# Patient Record
Sex: Female | Born: 1985
Health system: Southern US, Community
[De-identification: ages and names within clinical notes are randomized; demographics above are authoritative.]

---

## 2012-03-24 ENCOUNTER — Ambulatory Visit (INDEPENDENT_AMBULATORY_CARE_PROVIDER_SITE_OTHER): Payer: No Typology Code available for payment source | Admitting: Family Medicine

## 2012-03-24 ENCOUNTER — Encounter: Payer: Self-pay | Admitting: Family Medicine

## 2012-03-24 VITALS — BP 123/79 | HR 88 | Temp 98.6°F | Resp 16 | Ht 64.25 in | Wt 199.8 lb

## 2012-03-24 DIAGNOSIS — Z23 Encounter for immunization: Secondary | ICD-10-CM

## 2012-03-24 DIAGNOSIS — Z0000 Encounter for general adult medical examination without abnormal findings: Secondary | ICD-10-CM

## 2012-03-24 LAB — POCT CBC
HCT, POC: 44.8 % (ref 37.7–47.9)
Lymph, poc: 3.5 — AB (ref 0.6–3.4)
MCHC: 32.6 g/dL (ref 31.8–35.4)
MCV: 85.9 fL (ref 80–97)
POC LYMPH PERCENT: 37.3 %L (ref 10–50)
RDW, POC: 13.7 %

## 2012-03-24 MED ORDER — NORETHIN-ETH ESTRAD TRIPHASIC 0.5/0.75/1-35 MG-MCG PO TABS: 1.0000 | ORAL_TABLET | Freq: Every day | ORAL | Status: DC

## 2012-03-24 NOTE — Progress Notes (Signed)
  Subjective:    Patient ID: Krystal Gallagher, female    DOB: 11-Oct-1986, 26 y.o.   MRN: [id number]  HPI  Patient presents to clinic requesting pap smear and birth control.  This will patient's first pap smear. She is in a monogamous relationship which she states is healthy. No history of abuse  Denies vaginal discharge, dysuria or pelvic pain  Review of Systems  HENT: Positive for ear pain (L) ear with intermittant symptoms.   Genitourinary: Negative for dysuria, vaginal discharge, genital sores, menstrual problem and pelvic pain.      Objective:   Physical Exam  Constitutional: She appears well-developed.  HENT:  Head: Normocephalic and atraumatic.  Neck: Neck supple.  Cardiovascular: Normal rate, regular rhythm and normal heart sounds.   Pulmonary/Chest: Effort normal and breath sounds normal.  Abdominal: Soft. Bowel sounds are normal. There is no tenderness.  Genitourinary: Vagina normal and uterus normal. There is no rash or lesion on the right labia. There is no rash or lesion on the left labia. Cervix exhibits no motion tenderness. Right adnexum displays no mass and no tenderness. Left adnexum displays no mass and no tenderness. No erythema or tenderness around the vagina. No vaginal discharge found.  Neurological: She is alert.  Skin: Skin is warm.     U Preg - neg      Assessment & Plan:   1. Routine general medical examination at a health care facility  Lipid panel, TSH, Pap IG, CT/NG w/ reflex HPV when ASC-U, Tdap vaccine greater than or equal to 7yo IM, HIV antibody, RPR, POCT CBC, POCT urine pregnancy   See medications on AVS

## 2012-03-25 LAB — HIV ANTIBODY (ROUTINE TESTING W REFLEX): HIV: NONREACTIVE

## 2012-03-25 LAB — LIPID PANEL
Cholesterol: 221 mg/dL — ABNORMAL HIGH (ref 0–200)
LDL Cholesterol: 160 mg/dL — ABNORMAL HIGH (ref 0–99)
VLDL: 29 mg/dL (ref 0–40)

## 2012-03-26 LAB — RPR

## 2012-03-27 ENCOUNTER — Encounter: Payer: Self-pay | Admitting: Family Medicine

## 2012-03-28 ENCOUNTER — Other Ambulatory Visit: Payer: Self-pay | Admitting: Family Medicine

## 2012-03-30 LAB — PAP IG, CT-NG, RFX HPV ASCU

## 2012-03-31 ENCOUNTER — Telehealth: Payer: Self-pay | Admitting: Family Medicine

## 2012-03-31 NOTE — Telephone Encounter (Signed)
WANTS TO KNOW LAB RESULTS ° °

## 2012-03-31 NOTE — Telephone Encounter (Signed)
LM on patient's VM asking for return call to discuss BW.

## 2012-04-11 ENCOUNTER — Encounter: Payer: Self-pay | Admitting: Family Medicine

## 2012-04-11 ENCOUNTER — Ambulatory Visit (INDEPENDENT_AMBULATORY_CARE_PROVIDER_SITE_OTHER): Payer: No Typology Code available for payment source | Admitting: Family Medicine

## 2012-04-11 VITALS — BP 117/80 | HR 71 | Temp 97.9°F | Resp 18 | Ht 64.0 in | Wt 197.0 lb

## 2012-04-11 DIAGNOSIS — E039 Hypothyroidism, unspecified: Secondary | ICD-10-CM

## 2012-04-11 LAB — POCT SEDIMENTATION RATE: POCT SED RATE: 14 mm/hr (ref 0–22)

## 2012-04-11 MED ORDER — LEVOTHYROXINE SODIUM 25 MCG PO TABS: 25.0000 ug | ORAL_TABLET | Freq: Every day | ORAL | Status: DC

## 2012-04-11 NOTE — Progress Notes (Signed)
  Subjective:    Patient ID: Krystal Gallagher, female    DOB: [birth year], 26 y.o.   MRN: [id number]  HPI  Patient presents in follow up of screening blood work that showed an elevated TSH 12.0.  Per patient recent viral illness prior to lab work however did not notice any neck tenderness during that time. No known family history of thyroid disease  Patient admits to fatigue and malaise however no other symptoms consistent with thyroid disease   Review of Systems     Objective:   Physical Exam  Constitutional: She appears well-developed.  Neck: Neck supple. Thyromegaly: nontender (B) lobes full; nodules not palpable.  Cardiovascular: Normal rate, regular rhythm and normal heart sounds.   Pulmonary/Chest: Effort normal and breath sounds normal.  Neurological: She is alert.  Skin: Skin is warm.       No edema; patella reflexes normal without delayed upstroke     Results for orders placed in visit on 04/11/12  POCT SEDIMENTATION RATE      Component Value Range   POCT SED RATE 14  0 - 22 (mm/hr)       Assessment & Plan:  Hypothyroid-new diagnosis  Thyroid ultrasound Sed rate; repeat TSH Synthroid 25 mg QD; titirate up to achieve a TSH of 2.0-3.0 Follow up in 3 months for repeat TSH

## 2012-04-13 ENCOUNTER — Telehealth: Payer: Self-pay | Admitting: Family Medicine

## 2012-04-13 NOTE — Telephone Encounter (Signed)
LM regarding elevated TSH. Patient to increase Synthroid to 50 mcg daily after 10 days and recheck TSH in 3 weeks. Thyroid US ordered.

## 2012-05-02 ENCOUNTER — Encounter: Payer: Self-pay | Admitting: Family Medicine

## 2012-05-02 ENCOUNTER — Ambulatory Visit (INDEPENDENT_AMBULATORY_CARE_PROVIDER_SITE_OTHER): Payer: No Typology Code available for payment source | Admitting: Family Medicine

## 2012-05-02 VITALS — BP 115/77 | HR 59 | Temp 98.0°F | Resp 16 | Wt 197.0 lb

## 2012-05-02 DIAGNOSIS — E049 Nontoxic goiter, unspecified: Secondary | ICD-10-CM

## 2012-05-02 DIAGNOSIS — E039 Hypothyroidism, unspecified: Secondary | ICD-10-CM

## 2012-05-02 NOTE — Progress Notes (Signed)
  Subjective:    Patient ID: Krystal Gallagher, female    DOB: May 09, 1986, 26 y.o.   MRN: [id number]  HPI  Patient presents in follow up of hypothyroid Patient tolerated 25 mcg daily of synthroid however after increasing to 50 mcg daily felt "overcaffinated". Titrated back down to 25 mcg daily   Review of Systems     Objective:   Physical Exam  Constitutional: She appears well-developed.  HENT:       No ocular changes  Neck: Neck supple. Thyromegaly (goiter present) present.  Cardiovascular: Normal rate, regular rhythm and normal heart sounds.   Pulmonary/Chest: Effort normal and breath sounds normal.  Abdominal: Soft. Bowel sounds are normal.  Neurological: She is alert. She has normal reflexes.  Skin: Skin is warm.  Psychiatric: She has a normal mood and affect.        Assessment & Plan:   1. Hypothyroid; goiter TSH; thyroid ultrasound 05/03/12   Anticipatory guidance

## 2012-05-03 ENCOUNTER — Ambulatory Visit
Admission: RE | Admit: 2012-05-03 | Discharge: 2012-05-03 | Disposition: A | Discharge status: Home / Self Care | Payer: PRIVATE HEALTH INSURANCE | Source: Ambulatory Visit | Attending: Family Medicine | Admitting: Family Medicine

## 2012-05-03 ENCOUNTER — Other Ambulatory Visit: Payer: Self-pay

## 2012-05-03 DIAGNOSIS — E039 Hypothyroidism, unspecified: Secondary | ICD-10-CM

## 2012-05-03 LAB — TSH: TSH: 7.859 u[IU]/mL — ABNORMAL HIGH (ref 0.350–4.500)

## 2012-06-21 ENCOUNTER — Ambulatory Visit: Payer: Self-pay | Admitting: Family Medicine

## 2012-07-12 ENCOUNTER — Ambulatory Visit: Payer: No Typology Code available for payment source | Admitting: Family Medicine

## 2012-09-19 ENCOUNTER — Telehealth: Payer: Self-pay

## 2012-09-19 DIAGNOSIS — E039 Hypothyroidism, unspecified: Secondary | ICD-10-CM

## 2012-09-19 MED ORDER — LEVOTHYROXINE SODIUM 25 MCG PO TABS: 25.0000 ug | ORAL_TABLET | Freq: Every day | ORAL | Status: DC

## 2012-09-19 NOTE — Telephone Encounter (Signed)
Patient on Levothyroxine 25 mcg.

## 2012-09-19 NOTE — Telephone Encounter (Signed)
Patient scheduled a follow up/Rx Refill with Dr. Audria Nine next Tuesday but will run out of her thyroid medication a few days before her appt. She would like to know if we could call in a refill to last her until her appt date. [phone]  [employer]

## 2012-09-19 NOTE — Telephone Encounter (Signed)
Left message on machine that medication was sent to pharmacy. 

## 2012-09-19 NOTE — Telephone Encounter (Signed)
Rx sent to pharmacy   

## 2012-09-27 ENCOUNTER — Ambulatory Visit: Payer: Self-pay | Admitting: Family Medicine

## 2012-09-30 ENCOUNTER — Ambulatory Visit (INDEPENDENT_AMBULATORY_CARE_PROVIDER_SITE_OTHER): Payer: No Typology Code available for payment source | Admitting: Family Medicine

## 2012-09-30 ENCOUNTER — Encounter: Payer: Self-pay | Admitting: Family Medicine

## 2012-09-30 VITALS — BP 103/69 | HR 68 | Temp 98.6°F | Resp 16 | Ht 64.0 in | Wt 190.0 lb

## 2012-09-30 DIAGNOSIS — Z23 Encounter for immunization: Secondary | ICD-10-CM

## 2012-09-30 DIAGNOSIS — E039 Hypothyroidism, unspecified: Secondary | ICD-10-CM

## 2012-09-30 MED ORDER — LEVOTHYROXINE SODIUM 50 MCG PO TABS: 50.0000 ug | ORAL_TABLET | Freq: Every day | ORAL | Status: DC

## 2012-09-30 NOTE — Progress Notes (Signed)
S:   This 26 y.o. AA female has Hypothyroidism with medication on board since May 2013. She has been taking 2 tabs ( each) and feels like that is the right dose. She has infrequent palpitations only after exertion. She denies any difficulty swallowing, HAs, CP or tightness, dizziness or weakness, GI discomfort or tremor.  ROS; As per HPI.  O:  Filed Vitals:   09/30/12 1550  BP: 103/69  Pulse: 68  Temp: 98.6 F (37 C)  Resp: 16   GEN: In NAD; WN,WD.     Weight down 9 lbs. HENT: [place]/AT; EOMI w/ clear conj/scl. Oroph benign. NECK: Supple w/o LAN or TMG. Anterior aspect appears full and wide. COR: RRR. No m/g/r;no edema. LUNGS: CTA. NEURO: A&O x 3; CNs intact. DTRs 0-1+/=. Gait normal. Muscle tone normal.   A/P:  1. Hypothyroid  April 2013: LDL chol= 160, Total = 221 RX: Levothyroxine 50 mcg 1 tab daily pending labs TSH, T4, Free  LDL Cholesterol, Direct  2. Need for prophylactic vaccination and inoculation against influenza  Flu vaccine greater than or equal to 3yo preservative free IM

## 2012-09-30 NOTE — Patient Instructions (Signed)

## 2012-10-01 ENCOUNTER — Other Ambulatory Visit: Payer: Self-pay | Admitting: Family Medicine

## 2012-10-01 DIAGNOSIS — E039 Hypothyroidism, unspecified: Secondary | ICD-10-CM

## 2012-10-01 LAB — T4, FREE: Free T4: 1.06 ng/dL (ref 0.80–1.80)

## 2012-10-01 LAB — TSH: TSH: 9.547 u[IU]/mL — ABNORMAL HIGH (ref 0.350–4.500)

## 2012-10-01 LAB — LDL CHOLESTEROL, DIRECT: Direct LDL: 133 mg/dL — ABNORMAL HIGH

## 2012-10-01 MED ORDER — LEVOTHYROXINE SODIUM 50 MCG PO TABS: 50.0000 ug | ORAL_TABLET | Freq: Every day | ORAL | Status: DC

## 2012-10-01 MED ORDER — LEVOTHYROXINE SODIUM 88 MCG PO TABS: 88.0000 ug | ORAL_TABLET | Freq: Every day | ORAL | Status: DC

## 2012-10-01 NOTE — Progress Notes (Signed)
Quick Note:  Please call pt and advise that the following labs are abnormal... Thyroid test result shows that you need to be on a higher dose of medication. You have Levothyroxine 50 mcg tablets; take 2 tablets daily (= 100 mcg). I am changing your medication dose to 88 mcg; when you go to pick up your refill, please note the dose change. You will go back to 1 tablet daily (88 mcg). The pharmacy has been notified of the change.  Thyroid test will need to be rechecked in ~ 2 months. (I will place a future order for you to return in mid January 2014).  Copy to pt. ______

## 2013-02-21 ENCOUNTER — Other Ambulatory Visit: Payer: Self-pay

## 2013-02-21 DIAGNOSIS — E039 Hypothyroidism, unspecified: Secondary | ICD-10-CM

## 2013-02-21 MED ORDER — LEVOTHYROXINE SODIUM 88 MCG PO TABS: 88.0000 ug | ORAL_TABLET | Freq: Every day | ORAL | Status: DC

## 2013-02-21 MED ORDER — NORETHIN-ETH ESTRAD TRIPHASIC 0.5/0.75/1-35 MG-MCG PO TABS: 1.0000 | ORAL_TABLET | Freq: Every day | ORAL | Status: DC

## 2013-02-21 NOTE — Telephone Encounter (Signed)
Pended please advise.  

## 2013-02-21 NOTE — Telephone Encounter (Signed)
Pt has scheduled an appt with Dr. Audria Nine for 4/3. Would like refills of netcon 777 and thyroid meds to carry her til this appt.  [facility] [address]  [phone]

## 2013-03-02 ENCOUNTER — Encounter: Payer: Self-pay | Admitting: Family Medicine

## 2013-03-02 ENCOUNTER — Ambulatory Visit (INDEPENDENT_AMBULATORY_CARE_PROVIDER_SITE_OTHER): Payer: No Typology Code available for payment source | Admitting: Family Medicine

## 2013-03-02 VITALS — BP 118/98 | HR 68 | Temp 97.9°F | Resp 16 | Ht 64.0 in | Wt 191.4 lb

## 2013-03-02 DIAGNOSIS — E039 Hypothyroidism, unspecified: Secondary | ICD-10-CM

## 2013-03-02 DIAGNOSIS — E669 Obesity, unspecified: Secondary | ICD-10-CM

## 2013-03-02 LAB — T4, FREE: Free T4: 1.22 ng/dL (ref 0.80–1.80)

## 2013-03-02 LAB — TSH: TSH: 0.887 u[IU]/mL (ref 0.350–4.500)

## 2013-03-02 MED ORDER — LEVOTHYROXINE SODIUM 88 MCG PO TABS: 88.0000 ug | ORAL_TABLET | Freq: Every day | ORAL | Status: DC

## 2013-03-02 NOTE — Patient Instructions (Signed)
Exercise to Stay Healthy Exercise helps you become and stay healthy. EXERCISE IDEAS AND TIPS Choose exercises that: You enjoy. Fit into your day. You do not need to exercise really hard to be healthy. You can do exercises at a slow or medium level and stay healthy. You can: Stretch before and after working out. Try yoga, Pilates, or tai chi. Lift weights. Walk fast, swim, jog, run, climb stairs, bicycle, dance, or rollerskate. Take aerobic classes. Exercises that burn about 150 calories: Running 1  miles in 15 minutes. Playing volleyball for 45 to 60 minutes. Washing and waxing a car for 45 to 60 minutes. Playing touch football for 45 minutes. Walking 1  miles in 35 minutes. Pushing a stroller 1  miles in 30 minutes. Playing basketball for 30 minutes. Raking leaves for 30 minutes. Bicycling 5 miles in 30 minutes. Walking 2 miles in 30 minutes. Dancing for 30 minutes. Shoveling snow for 15 minutes. Swimming laps for 20 minutes. Walking up stairs for 15 minutes. Bicycling 4 miles in 15 minutes. Gardening for 30 to 45 minutes. Jumping rope for 15 minutes. Washing windows or floors for 45 to 60 minutes. Document Released: 12/19/2010 Document Revised: 02/08/2012 Document Reviewed: 12/19/2010 [facility] Patient Information 2013 [place], [place].    Exercise to Lose Weight Exercise and a healthy diet may help you lose weight. Your doctor may suggest specific exercises. EXERCISE IDEAS AND TIPS  Choose low-cost things you enjoy doing, such as walking, bicycling, or exercising to workout videos.  Take stairs instead of the elevator.  Walk during your lunch break.  Park your car further away from work or school.  Go to a gym or an exercise class.  Start with 5 to 10 minutes of exercise each day. Build up to 30 minutes of exercise 4 to 6 days a week.  Wear shoes with good support and comfortable clothes.  Stretch before and after working out.  Work out until you breathe  harder and your heart beats faster.  Drink extra water when you exercise.  Do not do so much that you hurt yourself, feel dizzy, or get very short of breath. Exercises that burn about 150 calories:  Running 1  miles in 15 minutes.  Playing volleyball for 45 to 60 minutes.  Washing and waxing a car for 45 to 60 minutes.  Playing touch football for 45 minutes.  Walking 1  miles in 35 minutes.  Pushing a stroller 1  miles in 30 minutes.  Playing basketball for 30 minutes.  Raking leaves for 30 minutes.  Bicycling 5 miles in 30 minutes.  Walking 2 miles in 30 minutes.  Dancing for 30 minutes.  Shoveling snow for 15 minutes.  Swimming laps for 20 minutes.  Walking up stairs for 15 minutes.  Bicycling 4 miles in 15 minutes.  Gardening for 30 to 45 minutes.  Jumping rope for 15 minutes.  Washing windows or floors for 45 to 60 minutes. Document Released: 12/19/2010 Document Revised: 02/08/2012 Document Reviewed: 12/19/2010 [facility] Patient Information 2013 [place], [place].

## 2013-03-03 NOTE — Progress Notes (Signed)
Quick Note:  Please contact pt and advise that labs are normal.    Vitamin D is low normal so get OTC supplement D3 1000 IU and take it daily along with daily sun exposure and Vitamin D-rich foods 3-4 times a week.  Copy to pt.  ______

## 2013-03-05 ENCOUNTER — Encounter: Payer: Self-pay | Admitting: Family Medicine

## 2013-03-05 DIAGNOSIS — E669 Obesity, unspecified: Secondary | ICD-10-CM | POA: Insufficient documentation

## 2013-03-05 NOTE — Progress Notes (Signed)
S:  This 27 y.o. AA female is here for Hypothyroidism f/u and labs. She reports feeling much better w/ more energy. Her goal now is a healthier lifestyle with weight loss. She is compliant with medication and denies any diaphoresis, fatigue, abnormal weight change, CP or palpitations, SOB or DOE, n/v or GI problems, myalgias, skin change or hair loss, HA, vision disturbance, tremors, weakness or sleep disturbances.  O:  Filed Vitals:   03/02/13 1520  BP: 118/98  Pulse: 68  Temp: 97.9 F (36.6 C)  Resp: 16   GEN: In NAD; WN,WD. HENT: [place]/AT; EOMI w/ clear conj/sclerae. Oroph clear and moist. COR: RRR. LUNGS: Normal resp rate and effort. SKIN: W&D. NUERO: A&O x 3; CNs intact. Nonfocal.  A/P: Unspecified hypothyroidism - stable; continue current dose.    Plan: levothyroxine (SYNTHROID, LEVOTHROID) 88 MCG tablet, TSH, T4, Free, T3, Free  Obesity (BMI 30.0-34.9) - encourage healthy weight loss plan.   Plan: Vitamin D, 25-hydroxy

## 2013-03-06 ENCOUNTER — Encounter: Payer: Self-pay | Admitting: Radiology

## 2013-03-22 ENCOUNTER — Telehealth: Payer: Self-pay

## 2013-03-22 MED ORDER — NORETHIN-ETH ESTRAD TRIPHASIC 0.5/0.75/1-35 MG-MCG PO TABS: 1.0000 | ORAL_TABLET | Freq: Every day | ORAL | Status: DC

## 2013-03-22 NOTE — Telephone Encounter (Signed)
PT STATES SHE WAS TO GET A REFILL ON HER BCP'S AND IT WASN'T DONE. PLEASE CALL [phone] AND IT WAS HER NOVUM   [facility] ON [address]

## 2013-03-22 NOTE — Telephone Encounter (Signed)
Pended please advise. She looks like she is due for f/u

## 2013-03-22 NOTE — Telephone Encounter (Signed)
I have sent a 1 month supply but she needs to return for a pap test

## 2013-03-23 NOTE — Telephone Encounter (Signed)
Left message for her, she needs RTC before this runs out.

## 2013-03-31 ENCOUNTER — Encounter: Payer: Self-pay | Admitting: Family Medicine

## 2013-03-31 ENCOUNTER — Ambulatory Visit (INDEPENDENT_AMBULATORY_CARE_PROVIDER_SITE_OTHER): Payer: No Typology Code available for payment source | Admitting: Family Medicine

## 2013-03-31 VITALS — BP 108/64 | HR 71 | Temp 98.1°F | Resp 16 | Ht 64.5 in | Wt 190.4 lb

## 2013-03-31 DIAGNOSIS — Z0000 Encounter for general adult medical examination without abnormal findings: Secondary | ICD-10-CM

## 2013-03-31 DIAGNOSIS — Z01419 Encounter for gynecological examination (general) (routine) without abnormal findings: Secondary | ICD-10-CM

## 2013-03-31 DIAGNOSIS — E039 Hypothyroidism, unspecified: Secondary | ICD-10-CM

## 2013-03-31 DIAGNOSIS — Z3041 Encounter for surveillance of contraceptive pills: Secondary | ICD-10-CM

## 2013-03-31 DIAGNOSIS — Z124 Encounter for screening for malignant neoplasm of cervix: Secondary | ICD-10-CM

## 2013-03-31 LAB — POCT URINALYSIS DIPSTICK
Bilirubin, UA: NEGATIVE
Blood, UA: NEGATIVE
Glucose, UA: NEGATIVE
Leukocytes, UA: NEGATIVE
Nitrite, UA: NEGATIVE
Protein, UA: NEGATIVE
Spec Grav, UA: 1.02
Urobilinogen, UA: 0.2
pH, UA: 7.5

## 2013-03-31 MED ORDER — NORETHIN-ETH ESTRAD TRIPHASIC 0.5/0.75/1-35 MG-MCG PO TABS: 1.0000 | ORAL_TABLET | Freq: Every day | ORAL | Status: DC

## 2013-03-31 NOTE — Patient Instructions (Signed)

## 2013-03-31 NOTE — Progress Notes (Signed)
Subjective:    Patient ID: Krystal Gallagher, female    DOB: Oct 04, 1986, 27 y.o.   MRN: [id number]  HPI   This 27 y.o. AA female is here for CPE/PAP. She uses OCPs w/o adverse effects. Pt  Has  hypothyroidism, on adequate replacement based on November 2013 labs. She has good energy   level and denies any symptoms c/w hypothyroidism (i.e hair loss, skin changes, abnormal weight  gain or constipation).  Pt is sexually active and in a stable monogamous relationship.   Immunizations: UTD; pt does not recall receiving Gardisil vaccine series.  No documentation re: Hepatitis B series.   Review of Systems  Constitutional: Negative.   HENT: Negative.   Eyes: Negative.   Respiratory: Negative.   Cardiovascular: Negative.   Gastrointestinal: Negative.   Endocrine: Negative.   Genitourinary: Negative.   Musculoskeletal: Negative.   Skin: Negative.   Allergic/Immunologic: Negative.   Neurological: Negative.   Hematological: Negative.   Psychiatric/Behavioral: Negative.        Objective:   Physical Exam  Nursing note and vitals reviewed. Constitutional: She is oriented to person, place, and time. Vital signs are normal. She appears well-developed and well-nourished. No distress.  HENT:  Head: Normocephalic and atraumatic.  Right Ear: Hearing, tympanic membrane, external ear and ear canal normal.  Left Ear: Hearing, tympanic membrane, external ear and ear canal normal.  Nose: Nose normal. No mucosal edema, rhinorrhea, nasal deformity or septal deviation.  Mouth/Throat: Uvula is midline, oropharynx is clear and moist and mucous membranes are normal. No oral lesions. Normal dentition. No dental caries.  Eyes: Conjunctivae, EOM and lids are normal. Pupils are equal, round, and reactive to light. No scleral icterus.  Fundoscopic exam:      The right eye shows no arteriolar narrowing, no AV nicking and no papilledema. The right eye shows red reflex.       The left eye shows no arteriolar  narrowing, no AV nicking and no papilledema. The left eye shows red reflex.  Neck: Normal range of motion. Neck supple. No thyromegaly present.  Cardiovascular: Normal rate, regular rhythm, normal heart sounds and intact distal pulses.  Exam reveals no gallop and no friction rub.   No murmur heard. Pulmonary/Chest: Effort normal and breath sounds normal. No respiratory distress. She exhibits no tenderness. Right breast exhibits no inverted nipple, no mass, no nipple discharge, no skin change and no tenderness. Left breast exhibits no inverted nipple, no mass, no nipple discharge, no skin change and no tenderness. Breasts are symmetrical.  Dense breast bilaterally.  Abdominal: Soft. Normal appearance and bowel sounds are normal. She exhibits no distension and no mass. There is no hepatosplenomegaly. There is no tenderness. There is no guarding and no CVA tenderness. No hernia.  Genitourinary: Vagina normal and uterus normal. There is no rash, tenderness or lesion on the right labia. There is no rash, tenderness or lesion on the left labia. Uterus is not deviated, not enlarged and not tender. Cervix exhibits no motion tenderness, no discharge and no friability. Right adnexum displays no mass, no tenderness and no fullness. Left adnexum displays no mass, no tenderness and no fullness. No erythema, tenderness or bleeding around the vagina. No signs of injury around the vagina. No vaginal discharge found.  Redundant clitoral hood tissue.  Musculoskeletal: Normal range of motion. She exhibits no edema and no tenderness.  Lymphadenopathy:    She has no cervical adenopathy.       Right: No inguinal adenopathy present.  Left: No inguinal adenopathy present.  Neurological: She is alert and oriented to person, place, and time. She has normal strength. She displays no atrophy. No cranial nerve deficit or sensory deficit. She exhibits normal muscle tone. Coordination and gait normal.  Reflex Scores:       Tricep reflexes are 1+ on the right side and 1+ on the left side.      Bicep reflexes are 1+ on the right side and 1+ on the left side.      Patellar reflexes are 1+ on the right side and 1+ on the left side. Skin: Skin is warm and dry. No rash noted. No erythema.  Psychiatric: She has a normal mood and affect. Her behavior is normal. Judgment and thought content normal.         Assessment & Plan:  Routine general medical examination at a health care facility - Plan: POCT urinalysis dipstick  Encounter for cervical Pap smear with pelvic exam - Plan: Pap IG, CT/NG w/ reflex HPV when ASC-U  Uses oral contraception  Unspecified hypothyroidism- continue current dose of medication pending lab results.   Meds ordered this encounter  Medications         . norethindrone-ethinyl estradiol (ORTHO-NOVUM 7/7/7, 28,) 0.5/0.75/1-35 MG-MCG tablet    Sig: Take 1 tablet by mouth daily.    Dispense:  3 Package    Refill:  4

## 2013-04-04 ENCOUNTER — Encounter: Payer: Self-pay | Admitting: Family Medicine

## 2013-04-04 ENCOUNTER — Telehealth: Payer: Self-pay | Admitting: Family Medicine

## 2013-04-04 DIAGNOSIS — IMO0002 Reserved for concepts with insufficient information to code with codable children: Secondary | ICD-10-CM

## 2013-04-04 DIAGNOSIS — Z3041 Encounter for surveillance of contraceptive pills: Secondary | ICD-10-CM | POA: Insufficient documentation

## 2013-04-04 DIAGNOSIS — R87612 Low grade squamous intraepithelial lesion on cytologic smear of cervix (LGSIL): Secondary | ICD-10-CM

## 2013-04-04 LAB — PAP IG, CT-NG, RFX HPV ASCU
Chlamydia Probe Amp: NEGATIVE
GC Probe Amp: NEGATIVE

## 2013-04-04 NOTE — Telephone Encounter (Signed)
I spoke with pt about abnormal PAP-Low Grade SIL w/ HPV/Mild Dysplasia/ CIN 1. This is the same result as last year. Pt to be referred to GYN for Colposcopy. She understands.  C/GC results (negative) discussed w/ pt.

## 2013-05-02 ENCOUNTER — Other Ambulatory Visit: Payer: Self-pay | Admitting: Obstetrics & Gynecology

## 2013-05-02 DIAGNOSIS — R7989 Other specified abnormal findings of blood chemistry: Secondary | ICD-10-CM

## 2013-05-09 ENCOUNTER — Other Ambulatory Visit: Payer: Self-pay

## 2013-05-15 ENCOUNTER — Ambulatory Visit
Admission: RE | Admit: 2013-05-15 | Discharge: 2013-05-15 | Disposition: A | Discharge status: Home / Self Care | Payer: PRIVATE HEALTH INSURANCE | Source: Ambulatory Visit | Attending: Obstetrics & Gynecology | Admitting: Obstetrics & Gynecology

## 2013-05-15 DIAGNOSIS — R7989 Other specified abnormal findings of blood chemistry: Secondary | ICD-10-CM

## 2013-07-07 ENCOUNTER — Ambulatory Visit: Payer: No Typology Code available for payment source | Admitting: Family Medicine

## 2013-08-14 IMAGING — US US TRANSVAGINAL NON-OB
1 series · 14 of 25 positions shown · non-contrast
Comparison: None

CLINICAL DATA: Elevated testosterone in a female.

TRANSABDOMINAL AND TRANSVAGINAL ULTRASOUND OF PELVIS
TECHNIQUE: Both transabdominal and transvaginal ultrasound
examinations of the pelvis were performed. Transabdominal technique
was performed for global imaging of the pelvis including uterus,
ovaries, adnexal regions, and pelvic cul-de-sac.
It was necessary to proceed with endovaginal exam following the
transabdominal exam to visualize the uterus, endometrium, ovaries,
and adnexa.

[Series 1: us transvaginal non-ob · 0.24mm/px · 14 of 54 slices shown]
[im 1/54]
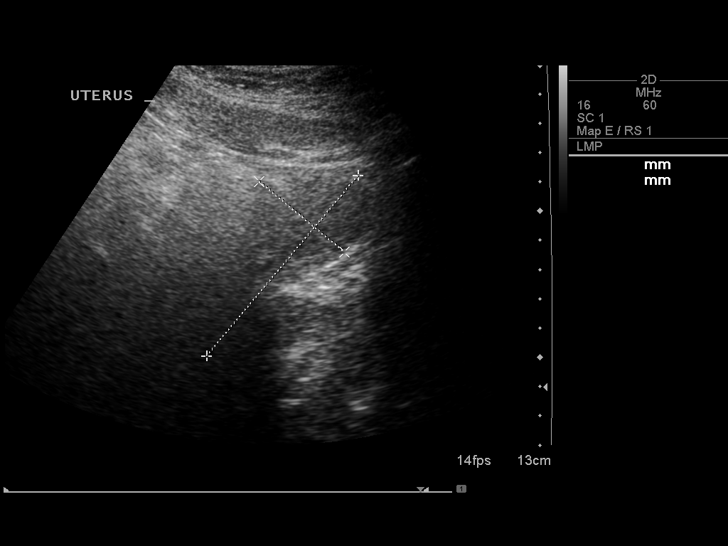
[im 5/54]
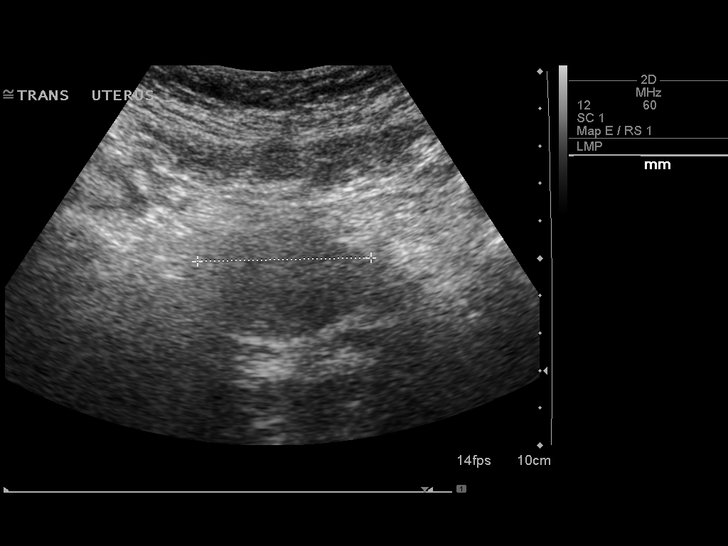
[im 9/54]
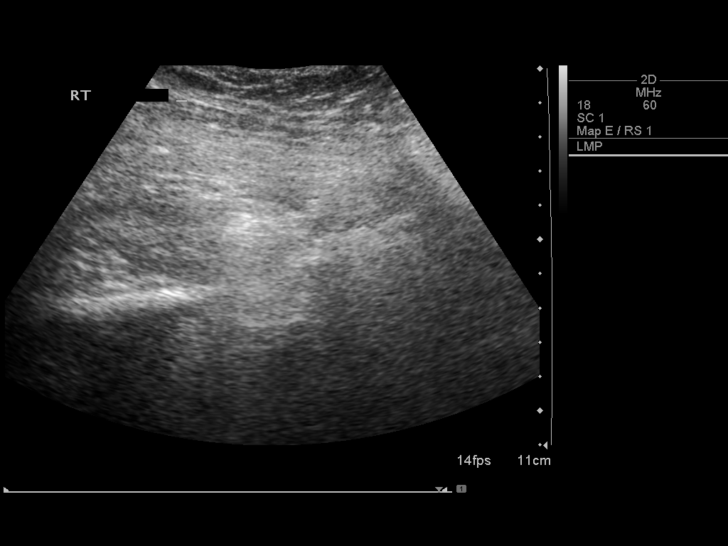
[im 14/54]
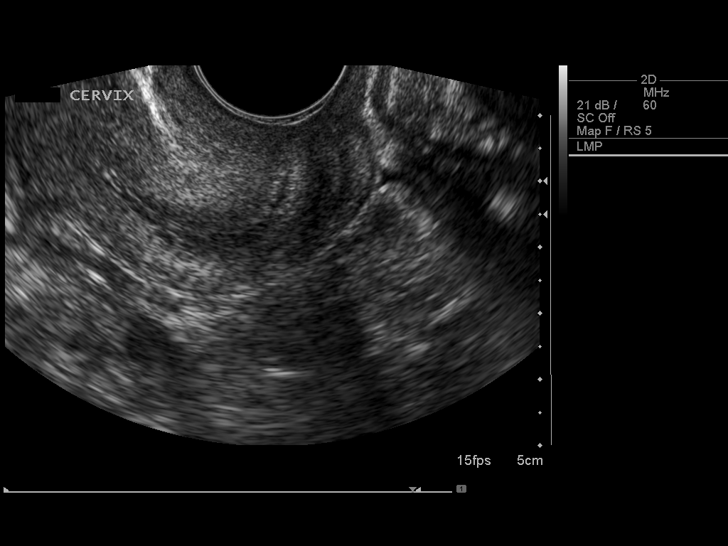
[im 18/54]
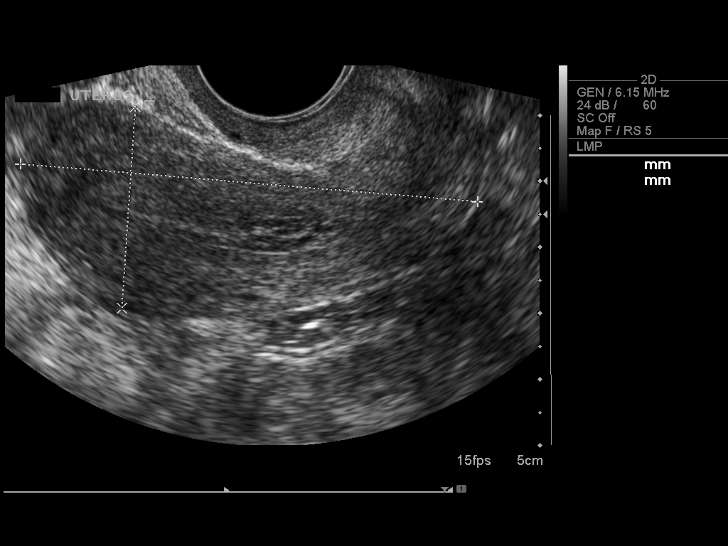
[im 20/54]
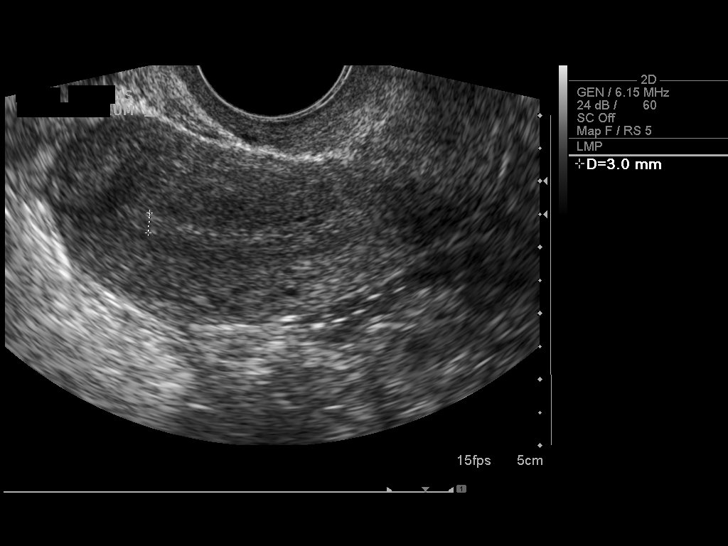
[im 25/54]
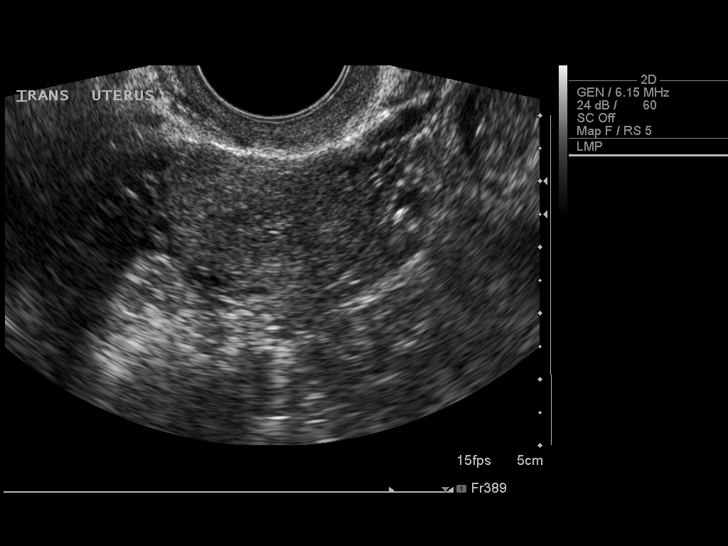
[im 29/54]
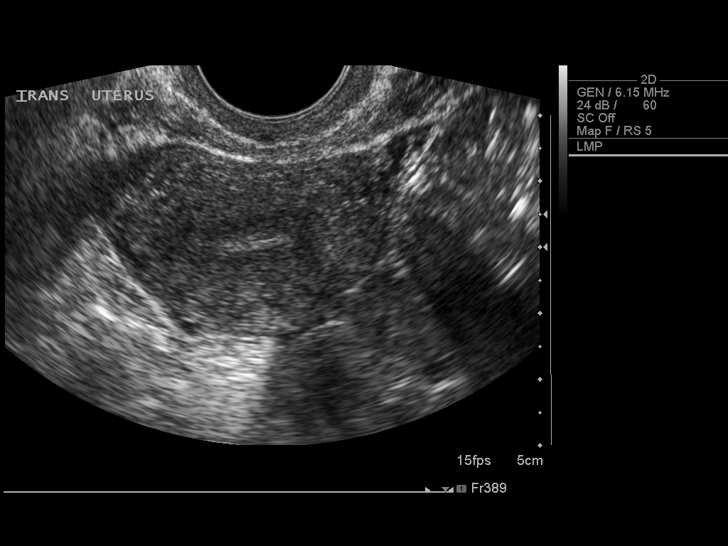
[im 34/54]
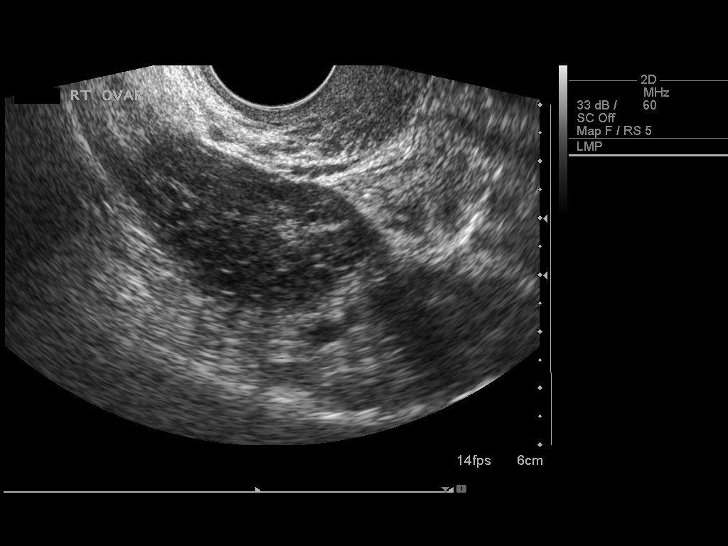
[im 36/54]
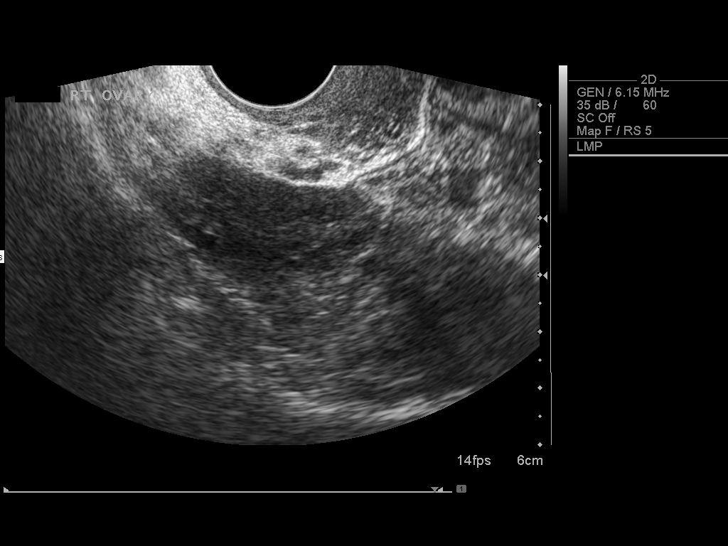
[im 40/54]
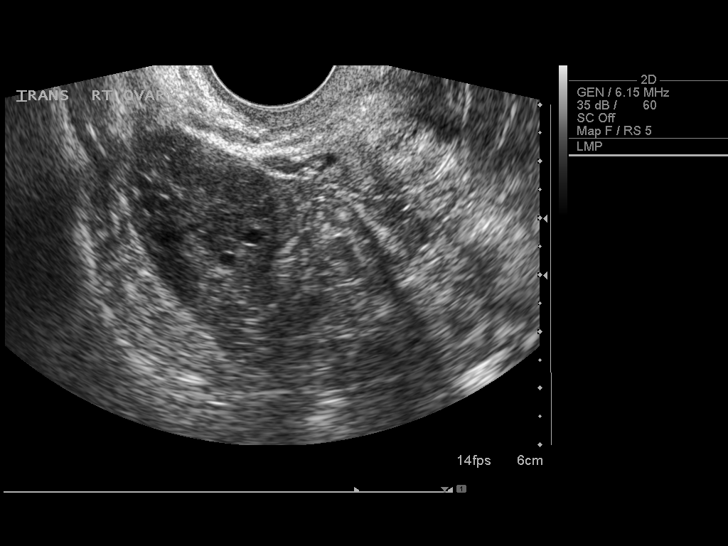
[im 45/54]
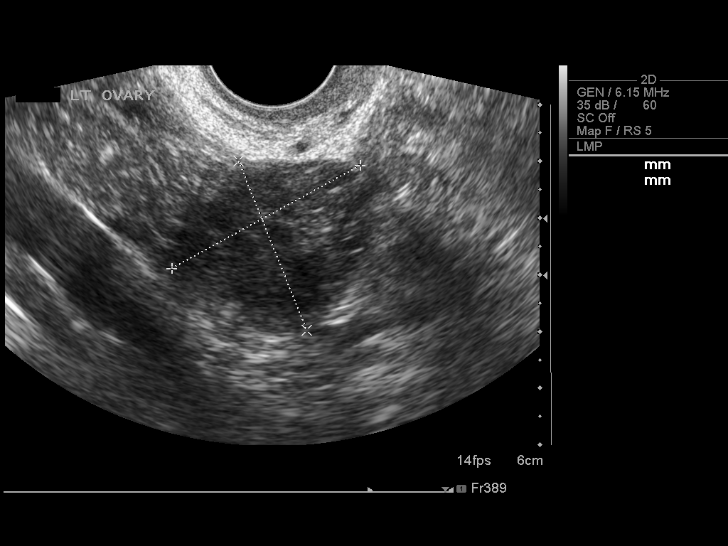
[im 49/54]
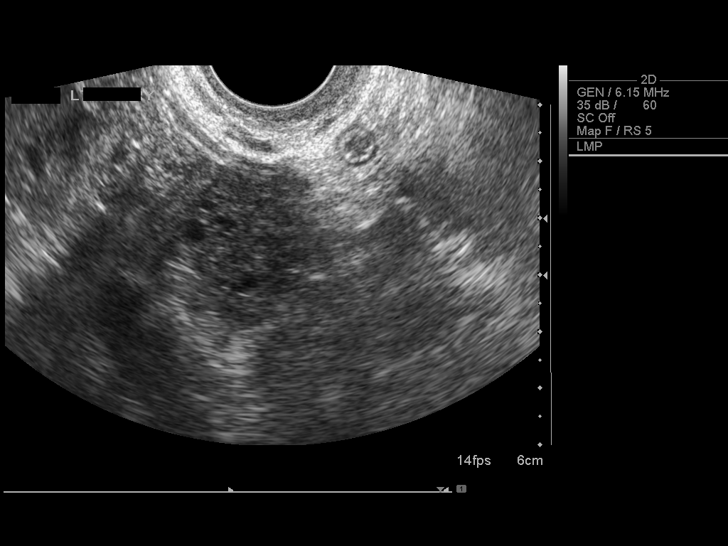
[im 54/54]
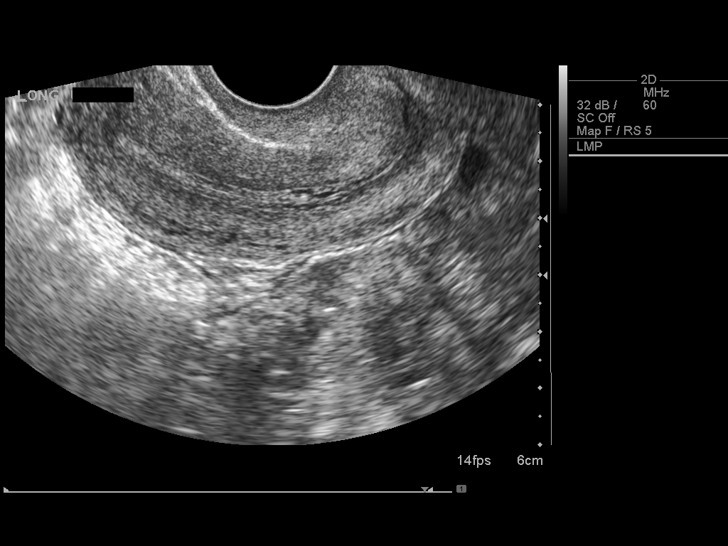

[14 of 25 positions shown; findings below may reference images not displayed]

FINDINGS: Uterus: Normal in size and appearance, 8.1 x 3.8 x 4.6 cm.

Endometrium: Normal.  3.0 mm in thickness.

Right ovary:  The ovary is slightly prominent measuring 4.5 x 2.4 x
3.0 cm.  There are a few follicles scattered throughout the ovary.

Left ovary: Slightly enlarged at 3.8 x 3.2 x 3.6 cm.  There are a
few follicles scattered throughout the ovary.

Other findings: No free fluid
IMPRESSION: Slightly prominent ovaries bilaterally.  The follicles are not
peripheral. Otherwise, normal exam.

## 2013-09-15 ENCOUNTER — Encounter: Payer: Self-pay | Admitting: Family Medicine

## 2013-09-15 ENCOUNTER — Ambulatory Visit (INDEPENDENT_AMBULATORY_CARE_PROVIDER_SITE_OTHER): Payer: No Typology Code available for payment source | Admitting: Family Medicine

## 2013-09-15 VITALS — BP 112/76 | HR 70 | Temp 98.3°F | Resp 16 | Ht 64.0 in | Wt 187.0 lb

## 2013-09-15 DIAGNOSIS — Z23 Encounter for immunization: Secondary | ICD-10-CM

## 2013-09-15 DIAGNOSIS — E786 Lipoprotein deficiency: Secondary | ICD-10-CM

## 2013-09-15 DIAGNOSIS — E781 Pure hyperglyceridemia: Secondary | ICD-10-CM

## 2013-09-15 DIAGNOSIS — E039 Hypothyroidism, unspecified: Secondary | ICD-10-CM

## 2013-09-15 LAB — T3, FREE: T3, Free: 3 pg/mL (ref 2.3–4.2)

## 2013-09-15 LAB — LIPID PANEL
LDL Cholesterol: 114 mg/dL — ABNORMAL HIGH (ref 0–99)
Total CHOL/HDL Ratio: 5.5 Ratio

## 2013-09-15 LAB — TSH: TSH: 0.502 u[IU]/mL (ref 0.350–4.500)

## 2013-09-15 MED ORDER — LEVOTHYROXINE SODIUM 88 MCG PO TABS: 88.0000 ug | ORAL_TABLET | Freq: Every day | ORAL | Status: DC

## 2013-09-15 NOTE — Progress Notes (Signed)
S:  This 27 y.o. AA female has hypothyroidism, on prescription medication. Pt feels good on current dose of medication. 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 Pt denies fatigue, significant weight change, vision changes, chest discomfort, SOB or DOE, edema, change in stools, hair or skin changes, weakness or cognitive changes.  Patient Active Problem List   Diagnosis Date Noted  . Uses oral contraception 04/04/2013  . Abnormal Pap smear, low grade squamous intraepithelial lesion (LGSIL) 04/04/2013  . Obesity (BMI 30.0-34.9) 03/05/2013  . HYPOTHYROIDISM 04/11/2012   PMHx, Soc Hx and Fam Hx reviewed.   ROS: As per HPI.  O: Filed Vitals:   09/15/13 1512  BP: 112/76  Pulse: 70  Temp: 98.3 F (36.8 C)  Resp: 16   GEN: in NAD; WN,WD. HENT; [place]/AT; EOMI w/ clear conj/sclerae. EACs/nose/oroph unremarkable. COR: RRR. LUNGS: Normal resp rate and effort. SKIN: W&D; intact w/o erythema, rashes or lesions. NEURO: A&O x 3; CNs intact. Nonfocal.  Reviewed April 2013 lipid profile w/ pt; CVD risk almost 2x average (Total Chol/HDL ratio= 6.9)  A/P: Unspecified hypothyroidism - Stable on current dose of Levothyroxine.   Plan: TSH, T3, free, levothyroxine (SYNTHROID, LEVOTHROID) 88 MCG tablet  Abnormally low high density lipoprotein (HDL) cholesterol with hypertriglyceridemia - Plan: Lipid panel  Need for prophylactic vaccination and inoculation against influenza - Plan: Flu Vaccine QUAD 36+ mos IM

## 2013-09-19 NOTE — Progress Notes (Signed)
Quick Note:  Please advise pt regarding following labs...  Thyroid test results are in the normal range; you are taking the right amount of medication. Continue to take your thyroid supplement by itself for good absorption and to avoid interference from other medications or supplements.  Lipid profile numbers are better except HDL ("good") cholesterol is too low. Regular exercise can help increase HDL cholesterol. Based on these numbers, your heart disease risk is still slightly above average.  Try taking Flax Seed Oil or Fish Oil capsule 1000 - 1200 mg 1 capsule daily to help improve these values. Take this oil-containing capsule alone; do not take it with any other solid pills or capsules.  Copy to pt. ______

## 2014-04-16 ENCOUNTER — Other Ambulatory Visit: Payer: Self-pay | Admitting: Family Medicine

## 2014-04-20 ENCOUNTER — Encounter: Payer: No Typology Code available for payment source | Admitting: Family Medicine

## 2014-05-01 ENCOUNTER — Other Ambulatory Visit: Payer: Self-pay | Admitting: Family Medicine

## 2014-05-14 ENCOUNTER — Other Ambulatory Visit: Payer: Self-pay | Admitting: Family Medicine

## 2014-06-01 ENCOUNTER — Other Ambulatory Visit: Payer: Self-pay | Admitting: Family Medicine

## 2014-06-05 ENCOUNTER — Encounter: Payer: No Typology Code available for payment source | Admitting: Family Medicine

## 2014-06-11 ENCOUNTER — Other Ambulatory Visit: Payer: Self-pay | Admitting: Family Medicine

## 2014-06-15 ENCOUNTER — Encounter: Payer: Self-pay | Admitting: Family Medicine

## 2014-06-15 ENCOUNTER — Encounter: Payer: No Typology Code available for payment source | Admitting: Family Medicine

## 2014-06-15 ENCOUNTER — Ambulatory Visit (INDEPENDENT_AMBULATORY_CARE_PROVIDER_SITE_OTHER): Payer: No Typology Code available for payment source | Admitting: Family Medicine

## 2014-06-15 VITALS — BP 110/74 | HR 74 | Temp 98.6°F | Resp 18 | Ht 65.0 in | Wt 192.0 lb

## 2014-06-15 DIAGNOSIS — Z760 Encounter for issue of repeat prescription: Secondary | ICD-10-CM

## 2014-06-15 DIAGNOSIS — E039 Hypothyroidism, unspecified: Secondary | ICD-10-CM

## 2014-06-15 MED ORDER — NORETHIN-ETH ESTRAD TRIPHASIC 0.5/0.75/1-35 MG-MCG PO TABS: ORAL_TABLET | ORAL | Status: DC

## 2014-06-15 NOTE — Patient Instructions (Signed)
Stress and Stress Management Stress is a normal reaction to life events. It is what you feel when life demands more than you are used to or more than you can handle. Some stress can be useful. For example, the stress reaction can help you catch the last bus of the day, study for a test, or meet a deadline at work. But stress that occurs too often or for too long can cause problems. It can affect your emotional health and interfere with relationships and normal daily activities. Too much stress can weaken your immune system and increase your risk for physical illness. If you already have a medical problem, stress can make it worse. CAUSES  All sorts of life events may cause stress. An event that causes stress for one person may not be stressful for another person. Major life events commonly cause stress. These may be positive or negative. Examples include losing your job, moving into a new home, getting married, having a baby, or losing a loved one. Less obvious life events may also cause stress, especially if they occur day after day or in combination. Examples include working long hours, driving in traffic, caring for children, being in debt, or being in a difficult relationship. SIGNS AND SYMPTOMS Stress may cause emotional symptoms including, the following:  Anxiety--This is feeling worried, afraid, on edge, overwhelmed, or out of control.  Anger--This is feeling irritated or impatient.  Depression--This is feeling sad, down, helpless, or guilty.  Difficulty focusing, remembering, or making decisions. Stress may cause physical symptoms, including the following:   Aches and pains--These may affect your head, neck, back, stomach, or other areas of your body.  Tight muscles or clenched jaw.  Low energy or trouble sleeping. Stress may cause unhealthy behaviors, including the following:   Eating to feel better (overeating) or skipping meals.  Sleeping too little, too much, or both.  Working  too much or putting off tasks (procrastination).  Smoking, drinking alcohol, or using drugs to feel better. DIAGNOSIS  Stress is diagnosed through an assessment by your health care provider. Your health care provider will ask questions about your symptoms and any stressful life events.Your health care provider will also ask about your medical history and may order blood tests or other tests. Certain medical conditions and medicine can cause physical symptoms similar to stress. Mental illness can cause emotional symptoms and unhealthy behaviors similar to stress. Your health care provider may refer you to a mental health professional for further evaluation.  TREATMENT  Stress management is the recommended treatment for stress.The goals of stress management are reducing stressful life events and coping with stress in healthy ways.  Techniques for reducing stressful life events include the following:  Stress identification--Self-monitor for stress and identify what causes stress for you. These skills may help you to avoid some stressful events.  Time management--Set your priorities, keep a calendar of events, and learn to say "no." These tools can help you avoid making too many commitments. Techniques for coping with stress include the following:  Rethinking the problem--Try to think realistically about stressful events rather than ignoring them or overreacting. Try to find the positives in a stressful situation rather than focusing on the negatives.  Exercise--Physical exercise can release both physical and emotional tension. The key is to find a form of exercise you enjoy and do it regularly.  Relaxation techniques. These relax the body and mind. Examples include yoga, meditation, tai chi, biofeedback, deep breathing, progressive muscle relaxation, listening to music, being   out in nature, journaling, and other hobbies. Again, the key is to find one or more that you enjoy and can do  regularly.  Healthy lifestyle--Eat a balanced diet, get plenty of sleep, and do not smoke. Avoid using alcohol or drugs to relax.  Strong support network--Spend time with family, friends, or other people you enjoy being around.Express your feelings and talk things over with someone you trust. Counseling or talktherapy with a mental health professional may be helpful if you are having difficulty managing stress on your own. Medicine is typically not recommended for the treatment of stress.Talk to your health care provider if you think you need medicine for symptoms of stress. HOME CARE INSTRUCTIONS:  Keep all follow up appointments with your health care provider.  Only take any prescribed medicines as directed by your health care provider.  Talk to your health care provider before starting any new prescription or over-the-counter medicines. SEEK MEDICAL CARE IF:  Your symptoms get worse or you start having new symptoms.  You feel overwhelmed by your problems and can no longer manage them on your own. SEEK IMMEDIATE MEDICAL CARE IF:  You feel like hurting yourself or someone else. Document Released: 05/12/2001 Document Revised: 11/21/2013 Document Reviewed: 07/11/2013 [facility] Patient Information 2015 [place], [place]. This information is not intended to replace advice given to you by your health care provider. Make sure you discuss any questions you have with your health care provider.        Mediterranean Diet  Why follow it? Research shows.   Those who follow the Mediterranean diet have a reduced risk of heart disease    The diet is associated with a reduced incidence of Parkinson's and Alzheimer's diseases   People following the diet may have longer life expectancies and lower rates of chronic diseases    The Dietary Guidelines for Americans recommends the Mediterranean diet as an eating plan to promote health and prevent disease  What Is the Mediterranean Diet?    Healthy  eating plan based on typical foods and recipes of Mediterranean-style cooking   The diet is primarily a plant based diet; these foods should make up a majority of meals   Starches - Plant based foods should make up a majority of meals - They are an important sources of vitamins, minerals, energy, antioxidants, and fiber - Choose whole grains, foods high in fiber and minimally processed items  - Typical grain sources include wheat, oats, barley, corn, brown rice, bulgar, farro, millet, polenta, couscous  - Various types of beans include chickpeas, lentils, fava beans, black beans, white beans   Fruits  Veggies - Large quantities of antioxidant rich fruits & veggies; 6 or more servings  - Vegetables can be eaten raw or lightly drizzled with oil and cooked  - Vegetables common to the traditional Mediterranean Diet include: artichokes, arugula, beets, broccoli, brussel sprouts, cabbage, carrots, celery, collard greens, cucumbers, eggplant, kale, leeks, lemons, lettuce, mushrooms, okra, onions, peas, peppers, potatoes, pumpkin, radishes, rutabaga, shallots, spinach, sweet potatoes, turnips, zucchini - Fruits common to the Mediterranean Diet include: apples, apricots, avocados, cherries, clementines, dates, figs, grapefruits, grapes, melons, nectarines, oranges, peaches, pears, pomegranates, strawberries, tangerines  Fats - Replace butter and margarine with healthy oils, such as olive oil, canola oil, and tahini  - Limit nuts to no more than a handful a day  - Nuts include walnuts, almonds, pecans, pistachios, pine nuts  - Limit or avoid candied, honey roasted or heavily salted nuts - Olives are central to  the Mediterranean diet - can be eaten whole or used in a variety of dishes   Meats Protein - Limiting red meat: no more than a few times a month - When eating red meat: choose lean cuts and keep the portion to the size of deck of cards - Eggs: approx. 0 to 4 times a week  - Fish and lean poultry: at  least 2 a week  - Healthy protein sources include, chicken, [place], lean beef, lamb - Increase intake of seafood such as tuna, salmon, trout, mackerel, shrimp, scallops - Avoid or limit high fat processed meats such as sausage and bacon  Dairy - Include moderate amounts of low fat dairy products  - Focus on healthy dairy such as fat free yogurt, skim milk, low or reduced fat cheese - Limit dairy products higher in fat such as whole or 2% milk, cheese, ice cream  Alcohol - Moderate amounts of red wine is ok  - No more than 5 oz daily for women (all ages) and men older than age 56  - No more than 10 oz of wine daily for men younger than 57  Other - Limit sweets and other desserts  - Use herbs and spices instead of salt to flavor foods  - Herbs and spices common to the traditional Mediterranean Diet include: basil, bay leaves, chives, cloves, cumin, fennel, garlic, lavender, marjoram, mint, oregano, parsley, pepper, rosemary, sage, savory, sumac, tarragon, thyme   It's not just a diet, it's a lifestyle:    The Mediterranean diet includes lifestyle factors typical of those in the region    Foods, drinks and meals are best eaten with others and savored   Daily physical activity is important for overall good health   This could be strenuous exercise like running and aerobics   This could also be more leisurely activities such as walking, housework, yard-work, or taking the stairs   Moderation is the key; a balanced and healthy diet accommodates most foods and drinks   Consider portion sizes and frequency of consumption of certain foods   Meal Ideas & Options:    Breakfast:  o Whole wheat toast or whole wheat English muffins with peanut butter & hard boiled egg o Steel cut oats topped with apples & cinnamon and skim milk  o Fresh fruit: banana, strawberries, melon, berries, peaches  o Smoothies: strawberries, bananas, greek yogurt, peanut butter o Low fat greek yogurt with blueberries and  granola  o Egg white omelet with spinach and mushrooms o Breakfast couscous: whole wheat couscous, apricots, skim milk, cranberries    Sandwiches:  o Hummus and grilled vegetables (peppers, zucchini, squash) on whole wheat bread   o Grilled chicken on whole wheat pita with lettuce, tomatoes, cucumbers or tzatziki  o Tuna salad on whole wheat bread: tuna salad made with greek yogurt, olives, red peppers, capers, green onions o Garlic rosemary lamb pita: lamb sauted with garlic, rosemary, salt & pepper; add lettuce, cucumber, greek yogurt to pita - flavor with lemon juice and black pepper    Seafood:  o Mediterranean grilled salmon, seasoned with garlic, basil, parsley, lemon juice and black pepper o Shrimp, lemon, and spinach whole-grain pasta salad made with low fat greek yogurt  o Seared scallops with lemon orzo  o Seared tuna steaks seasoned salt, pepper, coriander topped with tomato mixture of olives, tomatoes, olive oil, minced garlic, parsley, green onions and cappers    Meats:  o Herbed greek chicken salad with kalamata olives, cucumber,  feta  o Red bell peppers stuffed with spinach, bulgur, lean ground beef (or lentils) & topped with feta   o Kebabs: skewers of chicken, tomatoes, onions, zucchini, squash  o [place] burgers: made with red onions, mint, dill, lemon juice, feta cheese topped with roasted red peppers   Vegetarian o Cucumber salad: cucumbers, artichoke hearts, celery, red onion, feta cheese, tossed in olive oil & lemon juice  o Hummus and whole grain pita points with a greek salad (lettuce, tomato, feta, olives, cucumbers, red onion) o Lentil soup with celery, carrots made with vegetable broth, garlic, salt and pepper  o Tabouli salad: parsley, bulgur, mint, scallions, cucumbers, tomato, radishes, lemon juice, olive oil, salt and pepper. o

## 2014-06-16 LAB — THYROID PANEL WITH TSH
Free Thyroxine Index: 4.2 — ABNORMAL HIGH (ref 1.0–3.9)
T3 Uptake: 25.7 % (ref 22.5–37.0)
T4 TOTAL: 16.3 ug/dL — AB (ref 5.0–12.5)
TSH: 0.565 u[IU]/mL (ref 0.350–4.500)

## 2014-06-16 LAB — COMPLETE METABOLIC PANEL WITH GFR
ALBUMIN: 4 g/dL (ref 3.5–5.2)
ALK PHOS: 45 U/L (ref 39–117)
ALT: 17 U/L (ref 0–35)
AST: 15 U/L (ref 0–37)
BILIRUBIN TOTAL: 0.3 mg/dL (ref 0.2–1.2)
BUN: 9 mg/dL (ref 6–23)
CO2: 29 meq/L (ref 19–32)
Calcium: 9.7 mg/dL (ref 8.4–10.5)
Chloride: 100 mEq/L (ref 96–112)
Creat: 0.88 mg/dL (ref 0.50–1.10)
GFR, Est African American: >89 mL/min
GFR, Est Non African American: >89 mL/min
Glucose, Bld: 96 mg/dL (ref 70–99)
POTASSIUM: 4.4 meq/L (ref 3.5–5.3)
SODIUM: 139 meq/L (ref 135–145)
TOTAL PROTEIN: 7.5 g/dL (ref 6.0–8.3)

## 2014-06-17 ENCOUNTER — Other Ambulatory Visit: Payer: Self-pay | Admitting: Family Medicine

## 2014-06-17 MED ORDER — LEVOTHYROXINE SODIUM 75 MCG PO TABS: 75.0000 ug | ORAL_TABLET | Freq: Every day | ORAL | Status: DC

## 2014-06-17 NOTE — Progress Notes (Signed)
Quick Note:  Please advise pt regarding following labs... Your thyroid numbers indicate that you may be taking too much thyroid medication. I am reducing your dose to 75 mcg 1 tablet daily. Thyroid function lab work will be repeated at your next visit. Be sure to take this medication first thing in the mornings, by itself with a big glass of water, at least 30 minutes before you eat or drink anything else.  All other labs are normal.  Copy to pt. ______

## 2014-06-19 NOTE — Progress Notes (Signed)
S:  This 28 y.o. AA female has hypothyroidism and is here for medication refill. Pt also needs OCP refilled.  She feels well and thinks she is on adequate supplementation. She denies abnormal weight change, diaphoresis, fatigue, CP or tightness, palpitations, SOB or DOE, GI problems, HA, dizziness, numbness or weakness or syncope.  Patient Active Problem List   Diagnosis Date Noted  . Uses oral contraception 04/04/2013  . Abnormal Pap smear, low grade squamous intraepithelial lesion (LGSIL) 04/04/2013  . Obesity (BMI 30.0-34.9) 03/05/2013  . HYPOTHYROIDISM 04/11/2012    Prior to Admission medications   Medication Sig Start Date End Date Taking? Authorizing Provider  norethindrone-ethinyl estradiol (NECON 7/7/7) 0.5/0.75/1-35 MG-MCG tablet TAKE 1 TABLET BY MOUTH DAILY.   Yes Maurice MarchBarbara B Fahim Kats, MD  VITAMIN D, CHOLECALCIFEROL, PO Take by mouth daily.   Yes Historical Provider, MD  levothyroxine (SYNTHROID, LEVOTHROID) 75 MCG tablet Take 1 tablet (88 mcg total) by mouth daily.    Maurice MarchBarbara B Ariston Grandison, MD   PMHx, Surg Hx, Soc and Fam Hx reviewed.  ROS: As per HPI.  O: Filed Vitals:   06/15/14 1310  BP: 110/74  Pulse: 74  Temp: 98.6 F (37 C)  Resp: 18   GEN: in NAD; WN,WD. HENT: [place]/AT. EOMI w/ clear conj/sclerae. Otherwise unremarkable. COR: RRR. No edema. LUNGS: Unlabored resp. SKIN: W&D; intact w/o erythema or diaphoresis. MS: MAEs; no deformities, abnormal tone, c/c/e. NEURO: A&O x 3; CNs intact. Nonfocal.  A/P: Unspecified hypothyroidism - Will refill medication after lab resulted. Plan: Thyroid Panel With TSH, COMPLETE METABOLIC PANEL WITH GFR  Issue of repeat prescriptions Meds ordered this encounter  Medications  . norethindrone-ethinyl estradiol (NECON 7/7/7) 0.5/0.75/1-35 MG-MCG tablet    Sig: TAKE 1 TABLET BY MOUTH DAILY.    Dispense:  28 tablet    Refill:  5

## 2014-08-07 ENCOUNTER — Encounter: Payer: No Typology Code available for payment source | Admitting: Family Medicine

## 2014-08-29 ENCOUNTER — Encounter: Payer: No Typology Code available for payment source | Admitting: Family Medicine

## 2014-10-19 ENCOUNTER — Encounter: Payer: Self-pay | Admitting: Family Medicine

## 2014-10-19 ENCOUNTER — Ambulatory Visit (INDEPENDENT_AMBULATORY_CARE_PROVIDER_SITE_OTHER): Payer: No Typology Code available for payment source | Admitting: Family Medicine

## 2014-10-19 VITALS — BP 116/78 | HR 74 | Temp 98.6°F | Resp 16 | Ht 64.0 in | Wt 187.6 lb

## 2014-10-19 DIAGNOSIS — Z8742 Personal history of other diseases of the female genital tract: Secondary | ICD-10-CM

## 2014-10-19 DIAGNOSIS — Z01419 Encounter for gynecological examination (general) (routine) without abnormal findings: Secondary | ICD-10-CM

## 2014-10-19 DIAGNOSIS — Z0000 Encounter for general adult medical examination without abnormal findings: Secondary | ICD-10-CM

## 2014-10-19 DIAGNOSIS — Z124 Encounter for screening for malignant neoplasm of cervix: Secondary | ICD-10-CM

## 2014-10-19 DIAGNOSIS — Z139 Encounter for screening, unspecified: Secondary | ICD-10-CM

## 2014-10-19 DIAGNOSIS — Z3041 Encounter for surveillance of contraceptive pills: Secondary | ICD-10-CM

## 2014-10-19 DIAGNOSIS — E039 Hypothyroidism, unspecified: Secondary | ICD-10-CM

## 2014-10-19 DIAGNOSIS — Z23 Encounter for immunization: Secondary | ICD-10-CM

## 2014-10-19 LAB — POCT URINALYSIS DIPSTICK
BILIRUBIN UA: NEGATIVE
Blood, UA: NEGATIVE
GLUCOSE UA: NEGATIVE
KETONES UA: NEGATIVE
Leukocytes, UA: NEGATIVE
Nitrite, UA: NEGATIVE
Protein, UA: NEGATIVE
Urobilinogen, UA: 0.2
pH, UA: 6

## 2014-10-19 MED ORDER — LEVOTHYROXINE SODIUM 75 MCG PO TABS: 75.0000 ug | ORAL_TABLET | Freq: Every day | ORAL | Status: DC

## 2014-10-19 MED ORDER — NORETHIN-ETH ESTRAD TRIPHASIC 0.5/0.75/1-35 MG-MCG PO TABS: ORAL_TABLET | ORAL | Status: DC

## 2014-10-19 NOTE — Progress Notes (Signed)
Subjective:    Patient ID: Krystal Gallagher, female    DOB: 06/21/1986, 28 y.o.   MRN: [id number]  HPI  This 28 y.o. AA female is here for CPE/PAP. Hx of abnormal PAP; per pt, she was seen by GYN w/ evaluation including biopsy. She has no specific info regarding result of that evaluation, other than " everything was ok". She takes OCP w/o adverse effects and menses are regular. She is sexually active in a monogamous relationship (no condom use). Thyroid disorder is stable on current medication.  Patient Active Problem List   Diagnosis Date Noted  . Uses oral contraception 04/04/2013  . Abnormal Pap smear, low grade squamous intraepithelial lesion (LGSIL) 04/04/2013  . Obesity (BMI 30.0-34.9) 03/05/2013  . HYPOTHYROIDISM 04/11/2012    Prior to Admission medications   Medication Sig Start Date End Date Taking? Authorizing Provider  levothyroxine (SYNTHROID, LEVOTHROID) 75 MCG tablet Take 1 tablet (75 mcg total) by mouth daily.   Yes Maurice MarchBarbara B Quirino Kakos, MD  norethindrone-ethinyl estradiol (NECON 7/7/7) 0.5/0.75/1-35 MG-MCG tablet TAKE 1 TABLET BY MOUTH DAILY.   Yes Maurice MarchBarbara B Addiel Mccardle, MD  OVER THE COUNTER MEDICATION daily.   Yes Historical Provider, MD  VITAMIN D, CHOLECALCIFEROL, PO Take by mouth daily.   Yes Historical Provider, MD    History   Social History  . Marital Status: Single    Spouse Name: N/A    Number of Children: N/A  . Years of Education: N/A   Occupational History  . teacher    Social History Main Topics  . Smoking status: Never Smoker   . Smokeless tobacco: Not on file  . Alcohol Use: No  . Drug Use: No  . Sexual Activity: Yes    Birth Control/ Protection: Pill     Comment: number of sex partners in the last 12 months 1   Other Topics Concern  . Not on file   Social History Narrative   Exercise jogging 3 times per week for 1 hour. Single. Education: [employer].    Family History  Problem Relation Age of Onset  . Diabetes Paternal Grandfather   .  Diabetes Maternal Grandfather   . Hyperlipidemia Mother      Review of Systems  Constitutional: Negative.   HENT: Negative.   Eyes: Negative.   Respiratory: Negative.   Cardiovascular: Negative.   Gastrointestinal: Negative.   Endocrine: Negative.   Genitourinary: Negative.   Musculoskeletal: Negative.   Skin: Negative.   Allergic/Immunologic: Positive for environmental allergies.  Neurological: Negative.   Hematological: Negative.   Psychiatric/Behavioral: Negative.       Objective:   Physical Exam  Constitutional: She is oriented to person, place, and time. Vital signs are normal. She appears well-developed and well-nourished. No distress.  HENT:  Head: Normocephalic and atraumatic.  Right Ear: Hearing, tympanic membrane, external ear and ear canal normal.  Left Ear: Hearing, tympanic membrane, external ear and ear canal normal.  Nose: Nose normal. No mucosal edema, nasal deformity or septal deviation.  Mouth/Throat: Uvula is midline, oropharynx is clear and moist and mucous membranes are normal. No oral lesions. Normal dentition. No dental caries.  Eyes: Conjunctivae, EOM and lids are normal. Pupils are equal, round, and reactive to light. No scleral icterus.  Neck: Trachea normal, normal range of motion, full passive range of motion without pain and phonation normal. Neck supple. No spinous process tenderness and no muscular tenderness present. No thyroid mass and no thyromegaly present.  Cardiovascular: Normal rate, regular rhythm, S1 normal, S2  normal, normal heart sounds, intact distal pulses and normal pulses.   No extrasystoles are present. PMI is not displaced.  Exam reveals no gallop and no friction rub.   No murmur heard. Pulmonary/Chest: Effort normal and breath sounds normal. No respiratory distress. Right breast exhibits no inverted nipple, no mass, no nipple discharge, no skin change and no tenderness. Left breast exhibits no inverted nipple, no mass, no nipple  discharge, no skin change and no tenderness. Breasts are symmetrical.  Abdominal: Soft. Normal appearance and bowel sounds are normal. She exhibits no distension and no mass. There is no hepatosplenomegaly. There is no tenderness. There is no CVA tenderness.  Genitourinary: Vagina normal and uterus normal. There is no rash, tenderness or lesion on the right labia. There is no rash, tenderness or lesion on the left labia. Uterus is not deviated, not enlarged, not fixed and not tender. Cervix exhibits no motion tenderness, no discharge and no friability. Right adnexum displays no mass, no tenderness and no fullness. Left adnexum displays no mass, no tenderness and no fullness. No erythema or tenderness in the vagina. No signs of injury around the vagina. No vaginal discharge found.  Enlarged clitoris (this was evaluated by GYN with work-up for increased testosterone and adrenal excess as well as pelvic ultrasound).  Musculoskeletal:       Cervical back: Normal.       Thoracic back: Normal.       Lumbar back: Normal.  Remainder of exam unremarkable.  Lymphadenopathy:       Head (right side): No submental, no submandibular, no tonsillar, no preauricular, no posterior auricular and no occipital adenopathy present.       Head (left side): No submental, no submandibular, no tonsillar, no preauricular, no posterior auricular and no occipital adenopathy present.    She has no cervical adenopathy.       Right: No inguinal and no supraclavicular adenopathy present.       Left: No inguinal and no supraclavicular adenopathy present.  Neurological: She is alert and oriented to person, place, and time. She has normal strength and normal reflexes. She displays no atrophy. No cranial nerve deficit or sensory deficit. She exhibits normal muscle tone. She displays a negative Romberg sign. Coordination and gait normal.  Skin: Skin is warm, dry and intact. No ecchymosis, no lesion and no rash noted. She is not  diaphoretic. No cyanosis or erythema. Nails show no clubbing.  Psychiatric: She has a normal mood and affect. Her speech is normal and behavior is normal. Judgment and thought content normal. Cognition and memory are normal.  Nursing note and vitals reviewed.    Results for orders placed or performed in visit on 10/19/14  POCT urinalysis dipstick  Result Value Ref Range   Color, UA yellow    Clarity, UA clear    Glucose, UA neg    Bilirubin, UA neg    Ketones, UA neg'    Spec Grav, UA <=1.005    Blood, UA neg    pH, UA 6.0    Protein, UA neg    Urobilinogen, UA 0.2    Nitrite, UA neg    Leukocytes, UA Negative        Assessment & Plan:  Encounter for routine history and physical exam in female patient  Encounter for cervical Pap smear with pelvic exam - Plan: Pap IG, CT/NG NAA, and HPV (high risk)  History of abnormal cervical Pap smear - Plan: Pap IG, CT/NG NAA, and HPV (high  risk)  Hypothyroidism, unspecified hypothyroidism type - Stable on current dose of Levothyroxine.  No change at this time.       Plan: Thyroid Panel With TSH  Uses oral contraception- Refill OCP.  Need for prophylactic vaccination and inoculation against influenza - Plan: Flu Vaccine QUAD 36+ mos IM  Screening - Plan: POCT urinalysis dipstick  Meds ordered this encounter  Medications  . norethindrone-ethinyl estradiol (NECON 7/7/7) 0.5/0.75/1-35 MG-MCG tablet    Sig: TAKE 1 TABLET BY MOUTH DAILY.    Dispense:  28 tablet    Refill:  5  . levothyroxine (SYNTHROID, LEVOTHROID) 75 MCG tablet    Sig: Take 1 tablet (75 mcg total) by mouth daily.    Dispense:  30 tablet    Refill:  3

## 2014-10-20 LAB — THYROID PANEL WITH TSH
FREE THYROXINE INDEX: 2.6 (ref 1.4–3.8)
T3 Uptake: 22 % (ref 22–35)
T4, Total: 11.9 ug/dL (ref 4.5–12.0)
TSH: 0.809 u[IU]/mL (ref 0.350–4.500)

## 2014-10-21 ENCOUNTER — Encounter: Payer: Self-pay | Admitting: *Deleted

## 2014-10-21 NOTE — Progress Notes (Signed)
Quick Note:  Please notify pt that results are normal.   Provide pt with copy of labs. ______ 

## 2014-10-23 ENCOUNTER — Encounter: Payer: Self-pay | Admitting: Family Medicine

## 2014-10-23 LAB — PAP IG, CT-NG NAA, HPV HIGH-RISK
Chlamydia Probe Amp: NEGATIVE
GC Probe Amp: NEGATIVE
HPV DNA High Risk: NOT DETECTED

## 2014-10-23 NOTE — Progress Notes (Signed)
Quick Note:  Notify pt of Normal results. ______ 

## 2015-02-27 ENCOUNTER — Other Ambulatory Visit: Payer: Self-pay | Admitting: Family Medicine

## 2015-04-23 ENCOUNTER — Other Ambulatory Visit: Payer: Self-pay | Admitting: Family Medicine

## 2015-05-23 ENCOUNTER — Other Ambulatory Visit: Payer: Self-pay | Admitting: Family Medicine

## 2015-07-16 ENCOUNTER — Other Ambulatory Visit: Payer: Self-pay | Admitting: Physician Assistant

## 2015-08-31 ENCOUNTER — Other Ambulatory Visit: Payer: Self-pay | Admitting: Physician Assistant

## 2015-09-21 ENCOUNTER — Other Ambulatory Visit: Payer: Self-pay | Admitting: Physician Assistant

## 2015-10-02 ENCOUNTER — Encounter: Payer: Self-pay | Admitting: Family Medicine

## 2015-10-02 ENCOUNTER — Ambulatory Visit (INDEPENDENT_AMBULATORY_CARE_PROVIDER_SITE_OTHER): Payer: No Typology Code available for payment source | Admitting: Family Medicine

## 2015-10-02 VITALS — BP 112/86 | HR 76 | Temp 98.1°F | Resp 16 | Ht 64.0 in | Wt 192.0 lb

## 2015-10-02 DIAGNOSIS — Z3041 Encounter for surveillance of contraceptive pills: Secondary | ICD-10-CM

## 2015-10-02 DIAGNOSIS — Z13220 Encounter for screening for lipoid disorders: Secondary | ICD-10-CM | POA: Diagnosis not present

## 2015-10-02 DIAGNOSIS — Z131 Encounter for screening for diabetes mellitus: Secondary | ICD-10-CM

## 2015-10-02 DIAGNOSIS — E039 Hypothyroidism, unspecified: Secondary | ICD-10-CM | POA: Diagnosis not present

## 2015-10-02 DIAGNOSIS — Z130 Encounter for screening for diseases of the blood and blood-forming organs and certain disorders involving the immune mechanism: Secondary | ICD-10-CM

## 2015-10-02 LAB — LIPID PANEL
CHOLESTEROL: 163 mg/dL (ref 125–200)
HDL: 37 mg/dL — ABNORMAL LOW (ref >=46)
LDL Cholesterol: 107 mg/dL (ref <130)
TRIGLYCERIDES: 95 mg/dL (ref <150)
Total CHOL/HDL Ratio: 4.4 Ratio (ref <=5.0)
VLDL: 19 mg/dL (ref <30)

## 2015-10-02 LAB — TSH: TSH: 1.413 u[IU]/mL (ref 0.350–4.500)

## 2015-10-02 LAB — COMPREHENSIVE METABOLIC PANEL
ALBUMIN: 4 g/dL (ref 3.6–5.1)
ALK PHOS: 50 U/L (ref 33–115)
ALT: 18 U/L (ref 6–29)
AST: 16 U/L (ref 10–30)
BUN: 7 mg/dL (ref 7–25)
CHLORIDE: 99 mmol/L (ref 98–110)
CO2: 26 mmol/L (ref 20–31)
CREATININE: 0.76 mg/dL (ref 0.50–1.10)
Calcium: 9.2 mg/dL (ref 8.6–10.2)
Glucose, Bld: 87 mg/dL (ref 65–99)
Potassium: 4 mmol/L (ref 3.5–5.3)
SODIUM: 136 mmol/L (ref 135–146)
TOTAL PROTEIN: 7.2 g/dL (ref 6.1–8.1)
Total Bilirubin: 0.3 mg/dL (ref 0.2–1.2)

## 2015-10-02 LAB — CBC
HCT: 42 % (ref 36.0–46.0)
HEMOGLOBIN: 14.1 g/dL (ref 12.0–15.0)
MCH: 28.1 pg (ref 26.0–34.0)
MCHC: 33.6 g/dL (ref 30.0–36.0)
MCV: 83.7 fL (ref 78.0–100.0)
MPV: 9.1 fL (ref 8.6–12.4)
Platelets: 331 10*3/uL (ref 150–400)
RBC: 5.02 MIL/uL (ref 3.87–5.11)
RDW: 13.6 % (ref 11.5–15.5)
WBC: 8.3 10*3/uL (ref 4.0–10.5)

## 2015-10-02 LAB — HEMOGLOBIN A1C
HEMOGLOBIN A1C: 5.6 % (ref <5.7)
MEAN PLASMA GLUCOSE: 114 mg/dL (ref <117)

## 2015-10-02 MED ORDER — NORETHIN-ETH ESTRAD TRIPHASIC 0.5/0.75/1-35 MG-MCG PO TABS: 1.0000 | ORAL_TABLET | Freq: Every day | ORAL | Status: DC

## 2015-10-02 MED ORDER — LEVOTHYROXINE SODIUM 75 MCG PO TABS: ORAL_TABLET | ORAL | Status: DC

## 2015-10-02 NOTE — Patient Instructions (Signed)
It was good to see you today I will be in touch with your labs asap Please wait to fill your thyroid medication until we talk- just in case we have to change the dose Please consider signing up for mychart- this is the easiest way to communicate!  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   Take care!

## 2015-10-02 NOTE — Progress Notes (Signed)
[facility] [address], [place] [address] [phone]  Date:  10/02/2015   Name:  Krystal Gallagher   DOB:  08-01-1986   MRN:  [id number]  PCP:  Dow Adolph, MD    Chief Complaint: estab care; Follow-up; Throid check; and Medication Refill   History of Present Illness:  Krystal Gallagher is a 29 y.o. very pleasant female patient who presents with the following:  Here today for a recheck and labs- she is doing well overall, no concerns Needs her OCP refilled- she does not have HTN, no history of DVT or PE, no migraine with aura  Lab Results  Component Value Date   TSH 0.809 10/19/2014    She has been stale on her current dose of thyroid med for a while She is a pre- Engineer, maintenance ([employer]).   Never a smoker, does not drink alcohol.    She did have a little oatmeal this am- OW she is fasting.    She did have a tdap in 2913- declines a flu shot today but will likely get at some point this fall Patient Active Problem List   Diagnosis Date Noted  . Uses oral contraception 04/04/2013  . Abnormal Pap smear, low grade squamous intraepithelial lesion (LGSIL) 04/04/2013  . Obesity (BMI 30.0-34.9) 03/05/2013  . HYPOTHYROIDISM 04/11/2012    No past medical history on file.  No past surgical history on file.  Social History  Substance Use Topics  . Smoking status: Never Smoker   . Smokeless tobacco: None  . Alcohol Use: No    Family History  Problem Relation Age of Onset  . Diabetes Paternal Grandfather   . Diabetes Maternal Grandfather   . Hyperlipidemia Mother     No Known Allergies  Medication list has been reviewed and updated.  Current Outpatient Prescriptions on File Prior to Visit  Medication Sig Dispense Refill  . levothyroxine (SYNTHROID, LEVOTHROID) 75 MCG tablet TAKE 1 TABLET BY MOUTH EVERY DAY. 15 tablet 0  . NECON 7/7/7 0.5/0.75/1-35 MG-MCG tablet TAKE 1 TABLET BY MOUTH DAILY 28 tablet 5  . OVER THE COUNTER MEDICATION daily.    Marland Kitchen VITAMIN D,  CHOLECALCIFEROL, PO Take by mouth daily.     No current facility-administered medications on file prior to visit.    Review of Systems:  As per HPI- otherwise negative.   Physical Examination: Filed Vitals:   10/02/15 1520  BP: 112/86  Pulse: 76  Temp: 98.1 F (36.7 C)  Resp: 16   Filed Vitals:   10/02/15 1520  Height:  (1.626 m)  Weight: 192 lb (87.091 kg)   Body mass index is 32.94 kg/(m^2). Ideal Body Weight: Weight in (lb) to have BMI = 25: 145.3  GEN: WDWN, NAD, Non-toxic, A & O x 3, overweight, looks well HEENT: Atraumatic, Normocephalic. Neck supple. No masses, No LAD.  Bilateral TM wnl, oropharynx normal.  PEERL,EOMI.   Ears and Nose: No external deformity. CV: RRR, No M/G/R. No JVD. No thrill. No extra heart sounds. PULM: CTA B, no wheezes, crackles, rhonchi. No retractions. No resp. distress. No accessory muscle use. EXTR: No c/c/e NEURO Normal gait.  PSYCH: Normally interactive. Conversant. Not depressed or anxious appearing.  Calm demeanor.    Assessment and Plan: Hypothyroidism, unspecified hypothyroidism type - Plan: levothyroxine (SYNTHROID, LEVOTHROID) 75 MCG tablet, TSH  Screening for diabetes mellitus - Plan: Comprehensive metabolic panel, Hemoglobin A1c  Screening for hyperlipidemia - Plan: Lipid panel  Screening for deficiency anemia - Plan: CBC  Oral contraceptive pill surveillance - Plan: norethindrone-ethinyl estradiol (NECON 7/7/7) 0.5/0.75/1-35 MG-MCG tablet  Refilled medications and did labs as above Encouraged flu shot   Signed Abbe AmsterdamJessica Burnell Hurta, MD

## 2015-10-03 ENCOUNTER — Encounter: Payer: Self-pay | Admitting: Family Medicine

## 2015-10-08 ENCOUNTER — Telehealth: Payer: Self-pay

## 2015-10-16 ENCOUNTER — Ambulatory Visit: Payer: No Typology Code available for payment source | Admitting: Family Medicine

## 2015-10-30 ENCOUNTER — Encounter: Payer: Self-pay | Admitting: Family Medicine

## 2015-10-30 ENCOUNTER — Ambulatory Visit (INDEPENDENT_AMBULATORY_CARE_PROVIDER_SITE_OTHER): Payer: No Typology Code available for payment source | Admitting: Family Medicine

## 2015-10-30 VITALS — BP 105/72 | HR 74 | Temp 98.9°F | Resp 16 | Ht 64.5 in | Wt 193.4 lb

## 2015-10-30 DIAGNOSIS — Z01419 Encounter for gynecological examination (general) (routine) without abnormal findings: Secondary | ICD-10-CM | POA: Diagnosis not present

## 2015-10-30 DIAGNOSIS — Z124 Encounter for screening for malignant neoplasm of cervix: Secondary | ICD-10-CM

## 2015-10-30 DIAGNOSIS — Z113 Encounter for screening for infections with a predominantly sexual mode of transmission: Secondary | ICD-10-CM

## 2015-10-30 NOTE — Patient Instructions (Signed)
I will be in touch with your labs asap Take care!  

## 2015-10-30 NOTE — Progress Notes (Signed)
[facility] [address] [place] [address] [phone]  Date:  10/30/2015   Name:  Krystal Gallagher   DOB:  01-06-1986   MRN:  [id number]  PCP:  Dow Adolph, MD    Chief Complaint: Gynecologic Exam   History of Present Illness:  Krystal Gallagher is a 29 y.o. very pleasant female patient who presents with the following:  She had a pap a year ago- it was normal.  She did have an abnl pap 2 years ago- saw GYN and she had a bx, repeat pap was normal, told to go back to regular annual paps.    I saw her earlier this month for a check but did not do a pap. Her other labs were normal at that time.    LMP 11/15 She would like routine STI screening also Patient Active Problem List   Diagnosis Date Noted  . Uses oral contraception 04/04/2013  . Abnormal Pap smear, low grade squamous intraepithelial lesion (LGSIL) 04/04/2013  . Obesity (BMI 30.0-34.9) 03/05/2013  . HYPOTHYROIDISM 04/11/2012    No past medical history on file.  No past surgical history on file.  Social History  Substance Use Topics  . Smoking status: Never Smoker   . Smokeless tobacco: None  . Alcohol Use: No    Family History  Problem Relation Age of Onset  . Diabetes Paternal Grandfather   . Diabetes Maternal Grandfather   . Hyperlipidemia Mother     No Known Allergies  Medication list has been reviewed and updated.  Current Outpatient Prescriptions on File Prior to Visit  Medication Sig Dispense Refill  . levothyroxine (SYNTHROID, LEVOTHROID) 75 MCG tablet TAKE 1 TABLET BY MOUTH EVERY DAY. 90 tablet 3  . norethindrone-ethinyl estradiol (NECON 7/7/7) 0.5/0.75/1-35 MG-MCG tablet Take 1 tablet by mouth daily. 3 Package 3  . OVER THE COUNTER MEDICATION daily.    Marland Kitchen VITAMIN D, CHOLECALCIFEROL, PO Take by mouth daily.     No current facility-administered medications on file prior to visit.    Review of Systems:  As per HPI- otherwise negative.   Physical Examination: Filed  Vitals:   10/30/15 1625  BP: 105/72  Pulse: 74  Temp: 98.9 F (37.2 C)  Resp: 16   Filed Vitals:   10/30/15 1625  Height: 5' 4.5" (1.638 m)  Weight: 193 lb 6.4 oz (87.726 kg)   Body mass index is 32.7 kg/(m^2). Ideal Body Weight: Weight in (lb) to have BMI = 25: 147.6  GEN: WDWN, NAD, Non-toxic, A & O x 3, looks well, overweight HEENT: Atraumatic, Normocephalic. Neck supple. No masses, No LAD. Ears and Nose: No external deformity. CV: RRR, No M/G/R. No JVD. No thrill. No extra heart sounds. PULM: CTA B, no wheezes, crackles, rhonchi. No retractions. No resp. distress. No accessory muscle use. EXTR: No c/c/e NEURO Normal gait.  PSYCH: Normally interactive. Conversant. Not depressed or anxious appearing.  Calm demeanor.  Pelvic: normal, no vaginal lesions or discharge. Uterus normal, no CMT, no adnexal tendereness or masses,  Large clitoral hood.  Pt reports that the GYN also commented on this and checked her T level, told all looked ok  Assessment and Plan: Routine screening for STI (sexually transmitted infection) - Plan: Hepatitis B surface antibody, Hepatitis B surface antigen, Hepatitis C antibody, HIV antibody, RPR  Screening for cervical cancer - Plan: Pap IG, CT/NG w/ reflex HPV when ASC-U   Labs and pap pending as above.   Will plan further follow- up pending  labs.  Signed Abbe AmsterdamJessica Dayan Desa, MD

## 2015-10-31 LAB — PAP IG, CT-NG, RFX HPV ASCU
CHLAMYDIA PROBE AMP: NEGATIVE
GC PROBE AMP: NEGATIVE

## 2015-10-31 LAB — HEPATITIS B SURFACE ANTIGEN: Hepatitis B Surface Ag: NEGATIVE

## 2015-10-31 LAB — HEPATITIS C ANTIBODY: HCV AB: NEGATIVE

## 2015-10-31 LAB — RPR

## 2015-10-31 LAB — HIV ANTIBODY (ROUTINE TESTING W REFLEX): HIV 1&2 Ab, 4th Generation: NONREACTIVE

## 2015-10-31 LAB — HEPATITIS B SURFACE ANTIBODY, QUANTITATIVE: Hepatitis B-Post: 0 m[IU]/mL

## 2015-11-01 ENCOUNTER — Encounter: Payer: Self-pay | Admitting: Family Medicine

## 2016-09-25 ENCOUNTER — Other Ambulatory Visit: Payer: Self-pay | Admitting: Family Medicine

## 2016-09-25 DIAGNOSIS — Z3041 Encounter for surveillance of contraceptive pills: Secondary | ICD-10-CM

## 2016-09-25 DIAGNOSIS — E039 Hypothyroidism, unspecified: Secondary | ICD-10-CM

## 2016-09-28 ENCOUNTER — Other Ambulatory Visit: Payer: Self-pay | Admitting: Physician Assistant

## 2016-09-30 ENCOUNTER — Telehealth: Payer: Self-pay | Admitting: Family Medicine

## 2016-09-30 ENCOUNTER — Ambulatory Visit (INDEPENDENT_AMBULATORY_CARE_PROVIDER_SITE_OTHER): Payer: BLUE CROSS/BLUE SHIELD | Admitting: Family Medicine

## 2016-09-30 ENCOUNTER — Encounter: Payer: Self-pay | Admitting: Family Medicine

## 2016-09-30 VITALS — BP 126/84 | HR 77 | Temp 98.3°F | Resp 16 | Ht 64.0 in | Wt 182.0 lb

## 2016-09-30 DIAGNOSIS — R234 Changes in skin texture: Secondary | ICD-10-CM | POA: Diagnosis not present

## 2016-09-30 DIAGNOSIS — E6609 Other obesity due to excess calories: Secondary | ICD-10-CM | POA: Diagnosis not present

## 2016-09-30 DIAGNOSIS — Z3041 Encounter for surveillance of contraceptive pills: Secondary | ICD-10-CM

## 2016-09-30 DIAGNOSIS — E039 Hypothyroidism, unspecified: Secondary | ICD-10-CM | POA: Diagnosis not present

## 2016-09-30 DIAGNOSIS — Z6831 Body mass index (BMI) 31.0-31.9, adult: Secondary | ICD-10-CM

## 2016-09-30 DIAGNOSIS — E66811 Obesity, class 1: Secondary | ICD-10-CM

## 2016-09-30 DIAGNOSIS — R239 Unspecified skin changes: Secondary | ICD-10-CM

## 2016-09-30 LAB — TSH: TSH: 1.12 m[IU]/L

## 2016-09-30 MED ORDER — NORETHIN-ETH ESTRAD TRIPHASIC 0.5/0.75/1-35 MG-MCG PO TABS: 1.0000 | ORAL_TABLET | Freq: Every day | ORAL | 3 refills | Status: DC

## 2016-09-30 NOTE — Progress Notes (Signed)
Chief Complaint  Patient presents with  . Hypothyroidism  . Rash    whole body    HPI  Thyroid: Patient presents for evaluation of hypothyroidism. Current symptoms include denies fatigue, weight changes, heat/cold intolerance, bowel/skin changes or CVS symptoms. Patient denies denies fatigue, weight changes, heat/cold intolerance, bowel/skin changes or CVS symptoms.   Lab Results  Component Value Date   TSH 1.413 10/02/2015   Skin Changes She reports that if she brushes up against something she bruises easily.  She denies any streaking of the skin or abscesses.  She denies excessive changes to body hair.   Contraception Pt reports that she does not desire pregnancy currently She would like refills of her OCP Denies side effects Patient's last menstrual period was 09/22/2016 (exact date).  Obesity Pt reports that she is doing Insanity with Shawn T. She reports that she is getting Cardio for 3 days a week She is eating a balanced healthy diet and has cut down on red meat She is eating more vegetables and drinking more water  Wt Readings from Last 3 Encounters:  09/30/16 182 lb (82.6 kg)  10/30/15 193 lb 6.4 oz (87.7 kg)  10/02/15 192 lb (87.1 kg)   Body mass index is 31.24 kg/m.   No past medical history on file.  Current Outpatient Prescriptions  Medication Sig Dispense Refill  . levothyroxine (SYNTHROID, LEVOTHROID) 75 MCG tablet TAKE 1 TABLET BY MOUTH EVERY DAY. 90 tablet 3  . levothyroxine (SYNTHROID, LEVOTHROID) 75 MCG tablet TAKE 1 TABLET BY MOUTH EVERY DAY 30 tablet 0  . norethindrone-ethinyl estradiol (NECON 7/7/7) 0.5/0.75/1-35 MG-MCG tablet Take 1 tablet by mouth daily. 3 Package 3  . OVER THE COUNTER MEDICATION daily.    Marland Kitchen. VITAMIN D, CHOLECALCIFEROL, PO Take by mouth daily.     No current facility-administered medications for this visit.     Allergies: No Known Allergies  No past surgical history on file.  Social History   Social History  .  Marital status: Single    Spouse name: N/A  . Number of children: N/A  . Years of education: N/A   Occupational History  . Pharmacist, communityteacher [employer]   Social History Main Topics  . Smoking status: Never Smoker  . Smokeless tobacco: None  . Alcohol use No  . Drug use: No  . Sexual activity: Yes    Birth control/ protection: Pill     Comment: number of sex partners in the last 12 months 1   Other Topics Concern  . None   Social History Narrative   Exercise jogging 3 times per week for 1 hour. Single. Education: [employer].    ROS See hpi  Objective: Vitals:   09/30/16 1502  BP: 126/84  Pulse: 77  Resp: 16  Temp: 98.3 F (36.8 C)  SpO2: 97%  Weight: 182 lb (82.6 kg)  Height: 5\' 4"  (1.626 m)  Body mass index is 31.24 kg/m.   Physical Exam  Constitutional: She appears well-developed and well-nourished.  HENT:  Head: Normocephalic and atraumatic.  Neck: Normal range of motion. No thyromegaly present.  Cardiovascular: Normal rate, regular rhythm and normal heart sounds.  Exam reveals no friction rub.   No murmur heard. Pulmonary/Chest: Effort normal and breath sounds normal. No respiratory distress. She has no wheezes.    Assessment and Plan Krystal Gallagher was seen today for hypothyroidism and rash.  Diagnoses and all orders for this visit:  Acquired hypothyroidism- advised pt to continue current dose  Will refill medication once  tsh levels reviewed -     TSH  Encounter for surveillance of contraceptive pills- continue contraception until pregnancy desired No side effects  Recent skin changes- normal skin exam  Class 1 obesity due to excess calories without serious comorbidity with body mass index (BMI) of 31.0 to 31.9 in adult- discuss continued exercise     Krystal Gallagher

## 2016-09-30 NOTE — Telephone Encounter (Signed)
Called pt to let her know her ocps will be called in with 3 month supply.

## 2016-09-30 NOTE — Patient Instructions (Addendum)
     IF you received an x-ray today, you will receive an invoice from [facility]. Please contact [facility] at [phone] with questions or concerns regarding your invoice.   IF you received labwork today, you will receive an invoice from [facility]. Please contact [facility] at [phone] with questions or concerns regarding your invoice.   Our billing staff will not be able to assist you with questions regarding bills from these companies.  You will be contacted with the lab results as soon as they are available. The fastest way to get your results is to activate your My Chart account. Instructions are located on the last page of this paperwork. If you have not heard from us regarding the results in 2 weeks, please contact this office.      Hypothyroidism Hypothyroidism is a disorder of the thyroid. The thyroid is a large gland that is located in the lower front of the neck. The thyroid releases hormones that control how the body works. With hypothyroidism, the thyroid does not make enough of these hormones. CAUSES Causes of hypothyroidism may include:  Viral infections.  Pregnancy.  Your own defense system (immune system) attacking your thyroid.  Certain medicines.  Birth defects.  Past radiation treatments to your head or neck.  Past treatment with radioactive iodine.  Past surgical removal of part or all of your thyroid.  Problems with the gland that is located in the center of your brain (pituitary). SIGNS AND SYMPTOMS Signs and symptoms of hypothyroidism may include:  Feeling as though you have no energy (lethargy).  Inability to tolerate cold.  Weight gain that is not explained by a change in diet or exercise habits.  Dry skin.  Coarse hair.  Menstrual irregularity.  Slowing of thought processes.  Constipation.  Sadness or depression. DIAGNOSIS  Your health care provider may diagnose hypothyroidism with  blood tests and ultrasound tests. TREATMENT Hypothyroidism is treated with medicine that replaces the hormones that your body does not make. After you begin treatment, it may take several weeks for symptoms to go away. HOME CARE INSTRUCTIONS   Take medicines only as directed by your health care provider.  If you start taking any new medicines, tell your health care provider.  Keep all follow-up visits as directed by your health care provider. This is important. As your condition improves, your dosage needs may change. You will need to have blood tests regularly so that your health care provider can watch your condition. SEEK MEDICAL CARE IF:  Your symptoms do not get better with treatment.  You are taking thyroid replacement medicine and:  You sweat excessively.  You have tremors.  You feel anxious.  You lose weight rapidly.  You cannot tolerate heat.  You have emotional swings.  You have diarrhea.  You feel weak. SEEK IMMEDIATE MEDICAL CARE IF:   You develop chest pain.  You develop an irregular heartbeat.  You develop a rapid heartbeat.   This information is not intended to replace advice given to you by your health care provider. Make sure you discuss any questions you have with your health care provider.   Document Released: 11/16/2005 Document Revised: 12/07/2014 Document Reviewed: 04/03/2014 Elsevier Interactive Patient Education 2016 Elsevier Inc.  

## 2016-10-01 ENCOUNTER — Other Ambulatory Visit: Payer: Self-pay | Admitting: Family Medicine

## 2016-10-01 MED ORDER — LEVOTHYROXINE SODIUM 75 MCG PO TABS: ORAL_TABLET | ORAL | 3 refills | Status: DC

## 2017-10-08 ENCOUNTER — Other Ambulatory Visit: Payer: Self-pay | Admitting: Family Medicine

## 2017-10-08 DIAGNOSIS — Z3041 Encounter for surveillance of contraceptive pills: Secondary | ICD-10-CM

## 2017-10-23 NOTE — Progress Notes (Signed)
Chief Complaint  Patient presents with  . Hypothyroidism  . STD Check    pt states she is having discharge and irritation. Pt states it feels like she has a yeast infection x100.    HPI   Pt reports that she was in a long term relationship and did not use condoms now she has a new partner She states that they use condoms Patient's last menstrual period was 10/05/2017. She reports having thick discharge and used monistat test 10/15/2017 and she was thinking it was more acidic than a yeast infection   She is consistent with her thyroid medication  4 review of systems  History reviewed. No pertinent past medical history.  Current Outpatient Medications  Medication Sig Dispense Refill  . fexofenadine (ALLEGRA) 180 MG tablet Take 180 mg by mouth daily.    Marland Kitchen. levothyroxine (SYNTHROID, LEVOTHROID) 75 MCG tablet TAKE 1 TABLET BY MOUTH EVERY DAY. Needs office visit before further refills. 30 tablet 0  . norethindrone-ethinyl estradiol (NECON 7/7/7) 0.5/0.75/1-35 MG-MCG tablet Take 1 tablet daily by mouth. Needs office visit before further refills. 30 tablet 0  . OVER THE COUNTER MEDICATION daily.    Marland Kitchen. VITAMIN D, CHOLECALCIFEROL, PO Take by mouth daily.     No current facility-administered medications for this visit.     Allergies: No Known Allergies  History reviewed. No pertinent surgical history.  Social History   Socioeconomic History  . Marital status: Single    Spouse name: None  . Number of children: None  . Years of education: None  . Highest education level: None  Social Needs  . Financial resource strain: None  . Food insecurity - worry: None  . Food insecurity - inability: None  . Transportation needs - medical: None  . Transportation needs - non-medical: None  Occupational History  . Occupation: Magazine features editorteacher    Employer: [employer]  Tobacco Use  . Smoking status: Never Smoker  . Smokeless tobacco: Never Used  Substance and Sexual Activity  . Alcohol use:  No  . Drug use: No  . Sexual activity: Yes    Birth control/protection: Pill    Comment: number of sex partners in the last 12 months 1  Other Topics Concern  . None  Social History Narrative   Exercise jogging 3 times per week for 1 hour. Single. Education: [employer].    Family History  Problem Relation Age of Onset  . Diabetes Paternal Grandfather   . Diabetes Maternal Grandfather   . Hyperlipidemia Mother      ROS Review of Systems See HPI Constitution: No fevers or chills No malaise No diaphoresis Skin: No rash or itching Eyes: no blurry vision, no double vision GU: no dysuria or hematuria Neuro: no dizziness or headaches  all others reviewed and negative   Objective: Vitals:   10/25/17 1624  BP: 102/60  Pulse: 88  Resp: 18  Temp: 98.8 F (37.1 C)  TempSrc: Oral  SpO2: 98%  Weight: 184 lb 6.4 oz (83.6 kg)  Height: 5\' 4"  (1.626 m)    Physical Exam  Constitutional: She appears well-developed and well-nourished.  HENT:  Head: Normocephalic and atraumatic.  Eyes: Conjunctivae and EOM are normal.  Neck: Normal range of motion. Neck supple. No thyromegaly present.  Cardiovascular: Normal rate, regular rhythm and normal heart sounds.  No murmur heard. Pulmonary/Chest: Effort normal and breath sounds normal. No stridor. No respiratory distress. She has no wheezes.    Assessment and Plan Alcario Droughtrica was seen today for hypothyroidism and  std check.  Diagnoses and all orders for this visit:  Screen for STD (sexually transmitted disease)- discussed std prevention and screening -     HIV antibody -     GC/Chlamydia Probe Amp -     Hepatitis B surface antigen -     RPR -     POCT Wet + KOH Prep  Acquired hypothyroidism- will check levels -     TSH  Other orders -     Cancel: Flu Vaccine QUAD 36+ mos IM     Zoe A Stallings

## 2017-10-25 ENCOUNTER — Encounter: Payer: Self-pay | Admitting: Family Medicine

## 2017-10-25 ENCOUNTER — Ambulatory Visit: Payer: PRIVATE HEALTH INSURANCE | Admitting: Family Medicine

## 2017-10-25 ENCOUNTER — Other Ambulatory Visit: Payer: Self-pay

## 2017-10-25 VITALS — BP 102/60 | HR 88 | Temp 98.8°F | Resp 18 | Ht 64.0 in | Wt 184.4 lb

## 2017-10-25 DIAGNOSIS — E039 Hypothyroidism, unspecified: Secondary | ICD-10-CM

## 2017-10-25 DIAGNOSIS — Z113 Encounter for screening for infections with a predominantly sexual mode of transmission: Secondary | ICD-10-CM | POA: Diagnosis not present

## 2017-10-25 LAB — POCT WET + KOH PREP
Trich by wet prep: ABSENT
YEAST BY KOH: ABSENT
YEAST BY WET PREP: ABSENT

## 2017-10-25 MED ORDER — HYDROCORTISONE 2.5 % EX LOTN: TOPICAL_LOTION | Freq: Two times a day (BID) | CUTANEOUS | 0 refills | Status: DC

## 2017-10-25 NOTE — Patient Instructions (Addendum)
   IF you received an x-ray today, you will receive an invoice from [facility]. Please contact [facility] at [phone] with questions or concerns regarding your invoice.   IF you received labwork today, you will receive an invoice from [facility]. Please contact [facility] at [phone] with questions or concerns regarding your invoice.   Our billing staff will not be able to assist you with questions regarding bills from these companies.  You will be contacted with the lab results as soon as they are available. The fastest way to get your results is to activate your My Chart account. Instructions are located on the last page of this paperwork. If you have not heard from us regarding the results in 2 weeks, please contact this office.    Thyroid-Stimulating Hormone Test Why am I having this test? A thyroid-stimulating hormone (TSH) test is a blood test that is done to measure the level of TSH, also known as thyrotropin, in your blood. TSH is produced by the pituitary gland. The pituitary gland is a small organ located just below the brain, behind your eyes and nasal passages. It is part of a system that monitors and maintains thyroid hormone levels and thyroid gland function. Thyroid hormones affect many body parts and systems, including the system that affects how quickly your body burns fuel for energy. Your health care provider may recommend testing your TSH level if you have signs and symptoms of abnormal thyroid hormone levels. Knowing the level of TSH in your blood can help your health care provider:  Diagnose a thyroid gland or pituitary gland disorder.  Manage your condition and treatment if you have hypothyroidism or hyperthyroidism.  What kind of sample is taken? A blood sample is required for this test. It is usually collected by inserting a needle into a vein. How do I prepare for this test? There is no preparation required for this test. What are the  reference ranges? Reference rangesare considered healthy rangesestablished after testing a large group of healthy people. Reference rangesmay vary among different people, labs, and hospitals. It is your responsibility to obtain your test results. Ask the lab or department performing the test when and how you will get your results. Range of Normal Values:  Adult: 0.3-5 microunits/mL or 0.3-5 milliunits/L (SI units).  Newborn: 3-18 microunits/mL or 3-18 milliunits/L.  Cord: 3-12 microunits/mL or 3-12 milliunits/L.  What do the results mean? A high level of TSH may mean:  Your thyroid gland is not making enough thyroid hormones. When the thyroid gland does not make enough thyroid hormones, the pituitary gland releases TSH into the bloodstream. The higher-than-normal levels of TSH prompt the thyroid gland to release more thyroid hormones.  You are getting an insufficient level of thyroid hormone medicine, if you are receiving this type of treatment.  There is a problem with the pituitary gland (rare).  A low level of TSH can indicate a problem with the pituitary gland. Talk with your health care provider to discuss your results, treatment options, and if necessary, the need for more tests. Talk with your health care provider if you have any questions about your results. Talk with your health care provider to discuss your results, treatment options, and if necessary, the need for more tests. Talk with your health care provider if you have any questions about your results. This information is not intended to replace advice given to you by your health care provider. Make sure you discuss any questions you have with your   health care provider. Document Released: 12/11/2004 Document Revised: 07/19/2016 Document Reviewed: 04/11/2014 Elsevier Interactive Patient Education  2018 Elsevier Inc.  

## 2017-10-26 ENCOUNTER — Other Ambulatory Visit: Payer: Self-pay | Admitting: Family Medicine

## 2017-10-26 ENCOUNTER — Telehealth: Payer: Self-pay | Admitting: *Deleted

## 2017-10-26 LAB — RPR: RPR Ser Ql: NONREACTIVE

## 2017-10-26 LAB — TSH: TSH: 2.46 u[IU]/mL (ref 0.450–4.500)

## 2017-10-26 LAB — HIV ANTIBODY (ROUTINE TESTING W REFLEX): HIV Screen 4th Generation wRfx: NONREACTIVE

## 2017-10-26 LAB — HEPATITIS B SURFACE ANTIGEN: Hepatitis B Surface Ag: NEGATIVE

## 2017-10-26 MED ORDER — METRONIDAZOLE 500 MG PO TABS: 500.0000 mg | ORAL_TABLET | Freq: Two times a day (BID) | ORAL | 0 refills | Status: DC

## 2017-10-26 MED ORDER — LEVOTHYROXINE SODIUM 75 MCG PO TABS: ORAL_TABLET | ORAL | 3 refills | Status: DC

## 2017-10-26 NOTE — Telephone Encounter (Signed)
Lm to return call for lab results.  

## 2017-10-26 NOTE — Telephone Encounter (Signed)
Pt given results and recommendations per results of Dr. Creta LevinStallings. Pt verbalized understanding. No further concerns voiced at this time.

## 2017-10-27 LAB — GC/CHLAMYDIA PROBE AMP
CHLAMYDIA, DNA PROBE: NEGATIVE
NEISSERIA GONORRHOEAE BY PCR: NEGATIVE

## 2017-11-05 ENCOUNTER — Other Ambulatory Visit: Payer: Self-pay | Admitting: Family Medicine

## 2017-11-05 DIAGNOSIS — Z3041 Encounter for surveillance of contraceptive pills: Secondary | ICD-10-CM

## 2017-12-17 ENCOUNTER — Other Ambulatory Visit: Payer: Self-pay | Admitting: Family Medicine

## 2017-12-17 DIAGNOSIS — Z3041 Encounter for surveillance of contraceptive pills: Secondary | ICD-10-CM

## 2017-12-27 ENCOUNTER — Ambulatory Visit: Payer: PRIVATE HEALTH INSURANCE | Admitting: Family Medicine

## 2017-12-27 ENCOUNTER — Encounter: Payer: Self-pay | Admitting: Family Medicine

## 2017-12-27 ENCOUNTER — Other Ambulatory Visit: Payer: Self-pay

## 2017-12-27 VITALS — BP 108/76 | HR 85 | Temp 98.3°F | Resp 16 | Ht 64.0 in | Wt 189.6 lb

## 2017-12-27 DIAGNOSIS — R6889 Other general symptoms and signs: Secondary | ICD-10-CM | POA: Diagnosis not present

## 2017-12-27 DIAGNOSIS — J069 Acute upper respiratory infection, unspecified: Secondary | ICD-10-CM

## 2017-12-27 LAB — POCT INFLUENZA A/B
INFLUENZA A, POC: NEGATIVE
INFLUENZA B, POC: NEGATIVE

## 2017-12-27 MED ORDER — CETIRIZINE HCL 10 MG PO TABS: 10.0000 mg | ORAL_TABLET | Freq: Every day | ORAL | 11 refills | Status: DC

## 2017-12-27 MED ORDER — GUAIFENESIN ER 600 MG PO TB12: 600.0000 mg | ORAL_TABLET | Freq: Two times a day (BID) | ORAL | 0 refills | Status: DC

## 2017-12-27 MED ORDER — FLUTICASONE PROPIONATE 50 MCG/ACT NA SUSP: 2.0000 | Freq: Every day | NASAL | 6 refills | Status: DC

## 2017-12-27 NOTE — Patient Instructions (Addendum)
   IF you received an x-ray today, you will receive an invoice from [facility]. Please contact [facility] at [phone] with questions or concerns regarding your invoice.   IF you received labwork today, you will receive an invoice from [facility]. Please contact [facility] at [phone] with questions or concerns regarding your invoice.   Our billing staff will not be able to assist you with questions regarding bills from these companies.  You will be contacted with the lab results as soon as they are available. The fastest way to get your results is to activate your My Chart account. Instructions are located on the last page of this paperwork. If you have not heard from us regarding the results in 2 weeks, please contact this office.      Viral Respiratory Infection A respiratory infection is an illness that affects part of the respiratory system, such as the lungs, nose, or throat. Most respiratory infections are caused by either viruses or bacteria. A respiratory infection that is caused by a virus is called a viral respiratory infection. Common types of viral respiratory infections include:  A cold.  The flu (influenza).  A respiratory syncytial virus (RSV) infection.  How do I know if I have a viral respiratory infection? Most viral respiratory infections cause:  A stuffy or runny nose.  Yellow or green nasal discharge.  A cough.  Sneezing.  Fatigue.  Achy muscles.  A sore throat.  Sweating or chills.  A fever.  A headache.  How are viral respiratory infections treated? If influenza is diagnosed early, it may be treated with an antiviral medicine that shortens the length of time a person has symptoms. Symptoms of viral respiratory infections may be treated with over-the-counter and prescription medicines, such as:  Expectorants. These make it easier to cough up mucus.  Decongestant nasal sprays.  Health care providers do not prescribe  antibiotic medicines for viral infections. This is because antibiotics are designed to kill bacteria. They have no effect on viruses. How do I know if I should stay home from work or school? To avoid exposing others to your respiratory infection, stay home if you have:  A fever.  A persistent cough.  A sore throat.  A runny nose.  Sneezing.  Muscles aches.  Headaches.  Fatigue.  Weakness.  Chills.  Sweating.  Nausea.  Follow these instructions at home:  Rest as much as possible.  Take over-the-counter and prescription medicines only as told by your health care provider.  Drink enough fluid to keep your urine clear or pale yellow. This helps prevent dehydration and helps loosen up mucus.  Gargle with a salt-water mixture 3-4 times per day or as needed. To make a salt-water mixture, completely dissolve -1 tsp of salt in 1 cup of warm water.  Use nose drops made from salt water to ease congestion and soften raw skin around your nose.  Do not drink alcohol.  Do not use tobacco products, including cigarettes, chewing tobacco, and e-cigarettes. If you need help quitting, ask your health care provider. Contact a health care provider if:  Your symptoms last for 10 days or longer.  Your symptoms get worse over time.  You have a fever.  You have severe sinus pain in your face or forehead.  The glands in your jaw or neck become very swollen. Get help right away if:  You feel pain or pressure in your chest.  You have shortness of breath.  You faint or feel like   you will faint.  You have severe and persistent vomiting.  You feel confused or disoriented. This information is not intended to replace advice given to you by your health care provider. Make sure you discuss any questions you have with your health care provider. Document Released: 08/26/2005 Document Revised: 04/23/2016 Document Reviewed: 04/24/2015 Elsevier Interactive Patient Education  2018 Elsevier  Inc.  

## 2017-12-27 NOTE — Progress Notes (Signed)
Chief Complaint  Patient presents with  . URI    onset: 12/24/17, cold symptoms, no fever, cough or congestion, per pt feels like mucus is sitting on top of throat, taking nyquil and dayquil for symptoms, chills, bodyaches and ? fever at 5:30-6 am this morning but ok now.    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  Current Outpatient Medications  Medication Sig Dispense Refill  . fexofenadine (ALLEGRA) 180 MG tablet Take 180 mg by mouth daily.    Marland Kitchen. levothyroxine (SYNTHROID, LEVOTHROID) 75 MCG tablet TAKE 1 TABLET BY MOUTH EVERY DAY. Needs office visit before further refills. 90 tablet 3  . NORTREL 7/7/7 0.5/0.75/1-35 MG-MCG tablet TAKE 1 TABLET BY MOUTH DAILY 84 tablet 0  . OVER THE COUNTER MEDICATION daily.    Marland Kitchen. VITAMIN D, CHOLECALCIFEROL, PO Take by mouth daily.    . cetirizine (ZYRTEC) 10 MG tablet Take 1 tablet (10 mg total) by mouth daily. 30 tablet 11  . fluticasone (FLONASE) 50 MCG/ACT nasal spray Place 2 sprays into both nostrils daily. 16 g 6  . guaiFENesin (MUCINEX) 600 MG 12 hr tablet Take 1 tablet (600 mg total) by mouth 2 (two) times daily. 60 tablet 0  . hydrocortisone 2.5 % lotion Apply topically 2 (two) times daily. (Patient not taking: Reported on 12/27/2017) 59 mL 0  . metroNIDAZOLE (FLAGYL) 500 MG tablet Take 1 tablet (500 mg total) by mouth 2 (two) times daily. (Patient not taking: Reported on 12/27/2017) 14 tablet 0   No current facility-administered medications for this visit.     Allergies: No Known Allergies  No past surgical history on file.  Social History   Socioeconomic History  . Marital status: Single    Spouse name: None  . Number of children: None  . Years  of education: None  . Highest education level: None  Social Needs  . Financial resource strain: None  . Food insecurity - worry: None  . Food insecurity - inability: None  . Transportation needs - medical: None  . Transportation needs - non-medical: None  Occupational History  . Occupation: Magazine features editorteacher    Employer: [employer]  Tobacco Use  . Smoking status: Never Smoker  . Smokeless tobacco: Never Used  Substance and Sexual Activity  . Alcohol use: No  . Drug use: No  . Sexual activity: Yes    Birth control/protection: Pill    Comment: number of sex partners in the last 12 months 1  Other Topics Concern  . None  Social History Narrative   Exercise jogging 3 times per week for 1 hour. Single. Education: [employer].    Family History  Problem Relation Age of Onset  . Diabetes Paternal Grandfather   . Diabetes Maternal Grandfather   . Hyperlipidemia Mother      ROS Review of Systems See HPI  Objective: Vitals:   12/27/17 1148  BP: 108/76  Pulse: 85  Resp: 16  Temp: 98.3 F (36.8 C)  TempSrc: Oral  SpO2: 99%  Weight: 189 lb 9.6 oz (86 kg)  Height: 5\' 4"  (1.626 m)    Physical Exam General: alert, oriented, in NAD Head: normocephalic, atraumatic, no sinus tenderness  Eyes: EOM intact, no scleral icterus or conjunctival injection Ears: TM clear bilaterally Nose: mucosa nonerythematous, nonedematous Throat: no pharyngeal exudate or erythema Lymph: no posterior auricular, submental or cervical lymph adenopathy Heart: normal rate, normal sinus rhythm, no murmurs Lungs: clear to auscultation bilaterally, no wheezing   Assessment and Plan Der was seen today for uri.  Diagnoses and all orders for this visit:  Flu-like symptoms -     POCT Influenza A/B  Acute URI- negative for flu Still presenting with viral symptoms Supportive care advised Work note given  Other orders -     fluticasone (FLONASE) 50 MCG/ACT nasal spray; Place 2 sprays into both  nostrils daily. -     cetirizine (ZYRTEC) 10 MG tablet; Take 1 tablet (10 mg total) by mouth daily. -     guaiFENesin (MUCINEX) 600 MG 12 hr tablet; Take 1 tablet (600 mg total) by mouth 2 (two) times daily.     Cornelius Schuitema A Yulonda Wheeling

## 2017-12-29 ENCOUNTER — Ambulatory Visit: Payer: BLUE CROSS/BLUE SHIELD | Admitting: Family Medicine

## 2018-02-12 ENCOUNTER — Other Ambulatory Visit: Payer: Self-pay | Admitting: Family Medicine

## 2018-02-12 DIAGNOSIS — Z3041 Encounter for surveillance of contraceptive pills: Secondary | ICD-10-CM

## 2018-06-14 ENCOUNTER — Other Ambulatory Visit: Payer: Self-pay | Admitting: Family Medicine

## 2018-06-14 DIAGNOSIS — Z3041 Encounter for surveillance of contraceptive pills: Secondary | ICD-10-CM

## 2018-06-15 NOTE — Telephone Encounter (Signed)
Nortrel 7/7/7 tablets 28S refill request  LOV 09/30/16 with Dr. Creta LevinStallings    Needs appt  [facility] [phone] [place]

## 2018-06-15 NOTE — Telephone Encounter (Signed)
Refill request for norethindrone-ethinyl estradiol (Nortrel 7/7/7 ) denied. Pt needs office visit.  Will send to schedulers to call and schedule cpe with pap with stallings. Dgaddy, CMA

## 2018-12-05 ENCOUNTER — Other Ambulatory Visit: Payer: Self-pay | Admitting: Family Medicine

## 2018-12-05 NOTE — Telephone Encounter (Signed)
Copied from CRM [id number]. Topic: Quick Communication - Rx Refill/Question >> Dec 05, 2018  1:12 PM Baldo Daub L wrote: Medication: levothyroxine (SYNTHROID, LEVOTHROID) 75 MCG tablet  Has the patient contacted their pharmacy? Yes - states that this is on delay (pt doesn't know what that means) (Agent: If no, request that the patient contact the pharmacy for the refill.) (Agent: If yes, when and what did the pharmacy advise?)  Preferred Pharmacy (with phone number or street name): [facility] #[id number] Ginette Otto, [place] - [address] AT [facility] OF [facility] & [address] [phone] (Phone) [phone] (Fax)  Agent: Please be advised that RX refills may take up to 3 business days. We ask that you follow-up with your pharmacy.

## 2018-12-06 NOTE — Telephone Encounter (Signed)
Requested medication (s) are due for refill today: yes  Requested medication (s) are on the active medication list: yes  Last refill:  10/26/17 for 90 tabs and 3 refills  Future visit scheduled: yes  Notes to clinic:  Hypothyroid agents failed. TSH done in 2018. Prescription expired on 10/26/18.  Requested Prescriptions  Pending Prescriptions Disp Refills   levothyroxine (SYNTHROID, LEVOTHROID) 75 MCG tablet 90 tablet 3    Sig: TAKE 1 TABLET BY MOUTH EVERY DAY. Needs office visit before further refills.     Endocrinology:  Hypothyroid Agents Failed - 12/06/2018 10:10 AM      Failed - TSH needs to be rechecked within 3 months after an abnormal result. Refill until TSH is due.      Failed - TSH in normal range and within 360 days    TSH  Date Value Ref Range Status  10/25/2017 2.460 0.450 - 4.500 uIU/mL Final         Failed - Valid encounter within last 12 months    Recent Outpatient Visits          11 months ago Flu-like symptoms   Primary Care at [facility], Manus Rudd, MD   1 year ago Screen for STD (sexually transmitted disease)   Primary Care at [facility], Manus Rudd, MD   2 years ago Acquired hypothyroidism   Primary Care at [facility], [place] A, MD   3 years ago Routine screening for STI (sexually transmitted infection)   Primary Care at [facility], Gwenlyn Found, MD   3 years ago Hypothyroidism, unspecified hypothyroidism type   Primary Care at [facility], Gwenlyn Found, MD      Future Appointments            In 1 week Doristine Bosworth, MD Primary Care at [place], [facility]

## 2018-12-08 MED ORDER — LEVOTHYROXINE SODIUM 75 MCG PO TABS: ORAL_TABLET | ORAL | 0 refills | Status: DC

## 2018-12-19 ENCOUNTER — Encounter: Payer: Self-pay | Admitting: Family Medicine

## 2018-12-19 ENCOUNTER — Ambulatory Visit: Payer: PRIVATE HEALTH INSURANCE | Admitting: Family Medicine

## 2018-12-19 ENCOUNTER — Other Ambulatory Visit: Payer: Self-pay

## 2018-12-19 VITALS — BP 131/85 | HR 74 | Temp 98.0°F | Resp 17 | Ht 64.0 in | Wt 213.6 lb

## 2018-12-19 DIAGNOSIS — Z113 Encounter for screening for infections with a predominantly sexual mode of transmission: Secondary | ICD-10-CM | POA: Diagnosis not present

## 2018-12-19 DIAGNOSIS — E039 Hypothyroidism, unspecified: Secondary | ICD-10-CM

## 2018-12-19 NOTE — Patient Instructions (Signed)
° ° ° °  If you have lab work done today you will be contacted with your lab results within the next 2 weeks.  If you have not heard from us then please contact us. The fastest way to get your results is to register for My Chart. ° ° °IF you received an x-ray today, you will receive an invoice from [facility]. Please contact [facility] at [phone] with questions or concerns regarding your invoice.  ° °IF you received labwork today, you will receive an invoice from [facility]. Please contact [facility] at [phone] with questions or concerns regarding your invoice.  ° °Our billing staff will not be able to assist you with questions regarding bills from these companies. ° °You will be contacted with the lab results as soon as they are available. The fastest way to get your results is to activate your My Chart account. Instructions are located on the last page of this paperwork. If you have not heard from us regarding the results in 2 weeks, please contact this office. °  ° ° ° °

## 2018-12-19 NOTE — Progress Notes (Signed)
Established Patient Office Visit  Subjective:  Patient ID: Krystal Gallagher, female    DOB: July 26, 1986  Age: 33 y.o. MRN: [id number]  CC:  Chief Complaint  Patient presents with  . check thyroid    HPI Krystal Gallagher presents for   Hypothyroidism: Patient presents for evaluation of thyroid function. Symptoms consist of denies fatigue, weight changes, heat/cold intolerance, bowel/skin changes or CVS symptoms. Symptoms have present for 0 day. The symptoms are absent.  The problem has been stable.  Previous thyroid studies include TSH and total T4. The hypothyroidism is due to hypothyroidism.  Lab Results  Component Value Date   TSH 2.460 10/25/2017    She is requesting std screening  She denies vaginal discharge No itching or any concerns   No past medical history on file.  No past surgical history on file.  Family History  Problem Relation Age of Onset  . Diabetes Paternal Grandfather   . Diabetes Maternal Grandfather   . Hyperlipidemia Mother     Social History   Socioeconomic History  . Marital status: Single    Spouse name: Not on file  . Number of children: Not on file  . Years of education: Not on file  . Highest education level: Not on file  Occupational History  . Occupation: Magazine features editor: [employer]  Social Needs  . Financial resource strain: Not on file  . Food insecurity:    Worry: Not on file    Inability: Not on file  . Transportation needs:    Medical: Not on file    Non-medical: Not on file  Tobacco Use  . Smoking status: Never Smoker  . Smokeless tobacco: Never Used  Substance and Sexual Activity  . Alcohol use: No  . Drug use: No  . Sexual activity: Yes    Birth control/protection: Pill    Comment: number of sex partners in the last 12 months 1  Lifestyle  . Physical activity:    Days per week: Not on file    Minutes per session: Not on file  . Stress: Not on file  Relationships  . Social connections:    Talks on phone:  Not on file    Gets together: Not on file    Attends religious service: Not on file    Active member of club or organization: Not on file    Attends meetings of clubs or organizations: Not on file    Relationship status: Not on file  . Intimate partner violence:    Fear of current or ex partner: Not on file    Emotionally abused: Not on file    Physically abused: Not on file    Forced sexual activity: Not on file  Other Topics Concern  . Not on file  Social History Narrative   Exercise jogging 3 times per week for 1 hour. Single. Education: [employer].    Outpatient Medications Prior to Visit  Medication Sig Dispense Refill  . cetirizine (ZYRTEC) 10 MG tablet Take 1 tablet (10 mg total) by mouth daily. 30 tablet 11  . fexofenadine (ALLEGRA) 180 MG tablet Take 180 mg by mouth daily.    . fluticasone (FLONASE) 50 MCG/ACT nasal spray Place 2 sprays into both nostrils daily. 16 g 6  . levothyroxine (SYNTHROID, LEVOTHROID) 75 MCG tablet TAKE 1 TABLET BY MOUTH EVERY DAY. Needs office visit before further refills. 30 tablet 0  . norethindrone-ethinyl estradiol (NORTREL 7/7/7) 0.5/0.75/1-35 MG-MCG tablet Take 1 tablet by mouth daily.  Office visit needed for additional refills 84 tablet 0  . OVER THE COUNTER MEDICATION daily.    Marland Kitchen. VITAMIN D, CHOLECALCIFEROL, PO Take by mouth daily.    Marland Kitchen. guaiFENesin (MUCINEX) 600 MG 12 hr tablet Take 1 tablet (600 mg total) by mouth 2 (two) times daily. (Patient not taking: Reported on 12/19/2018) 60 tablet 0  . hydrocortisone 2.5 % lotion Apply topically 2 (two) times daily. (Patient not taking: Reported on 12/27/2017) 59 mL 0  . metroNIDAZOLE (FLAGYL) 500 MG tablet Take 1 tablet (500 mg total) by mouth 2 (two) times daily. (Patient not taking: Reported on 12/27/2017) 14 tablet 0   No facility-administered medications prior to visit.     No Known Allergies  ROS Review of Systems Review of Systems  Constitutional: Negative for activity change, appetite change,  chills and fever.  HENT: Negative for congestion, nosebleeds, trouble swallowing and voice change.   Respiratory: Negative for cough, shortness of breath and wheezing.   Gastrointestinal: Negative for diarrhea, nausea and vomiting.  Genitourinary: Negative for difficulty urinating, dysuria, flank pain and hematuria.  Musculoskeletal: Negative for back pain, joint swelling and neck pain.  Neurological: Negative for dizziness, speech difficulty, light-headedness and numbness.  See HPI. All other review of systems negative.     Objective:    Physical Exam  BP 131/85 (BP Location: Left Arm, Patient Position: Sitting, Cuff Size: Large)   Pulse 74   Temp 98 F (36.7 C) (Oral)   Resp 17   Ht 5\' 4"  (1.626 m)   Wt 213 lb 9.6 oz (96.9 kg)   LMP 11/08/2018   SpO2 98%   BMI 36.66 kg/m  Wt Readings from Last 3 Encounters:  12/19/18 213 lb 9.6 oz (96.9 kg)  12/27/17 189 lb 9.6 oz (86 kg)  10/25/17 184 lb 6.4 oz (83.6 kg)   Physical Exam  Constitutional: Oriented to person, place, and time. Appears well-developed and well-nourished.  HENT:  Head: Normocephalic and atraumatic.  Eyes: Conjunctivae and EOM are normal.  Neck: supple, no thyromegaly Cardiovascular: Normal rate, regular rhythm, normal heart sounds and intact distal pulses.  No murmur heard. Pulmonary/Chest: Effort normal and breath sounds normal. No stridor. No respiratory distress. Has no wheezes.  Neurological: Is alert and oriented to person, place, and time.  Skin: Skin is warm. Capillary refill takes less than 2 seconds.  Psychiatric: Has a normal mood and affect. Behavior is normal. Judgment and thought content normal.    Health Maintenance Due  Topic Date Due  . PAP SMEAR-Modifier  10/29/2018    There are no preventive care reminders to display for this patient.  Lab Results  Component Value Date   TSH 2.460 10/25/2017   Lab Results  Component Value Date   WBC 8.3 10/02/2015   HGB 14.1 10/02/2015   HCT  42.0 10/02/2015   MCV 83.7 10/02/2015   PLT 331 10/02/2015   Lab Results  Component Value Date   NA 136 10/02/2015   K 4.0 10/02/2015   CO2 26 10/02/2015   GLUCOSE 87 10/02/2015   BUN 7 10/02/2015   CREATININE 0.76 10/02/2015   BILITOT 0.3 10/02/2015   ALKPHOS 50 10/02/2015   AST 16 10/02/2015   ALT 18 10/02/2015   PROT 7.2 10/02/2015   ALBUMIN 4.0 10/02/2015   CALCIUM 9.2 10/02/2015   Lab Results  Component Value Date   CHOL 163 10/02/2015   Lab Results  Component Value Date   HDL 37 (L) 10/02/2015   Lab Results  Component Value Date   LDLCALC 107 10/02/2015   Lab Results  Component Value Date   TRIG 95 10/02/2015   Lab Results  Component Value Date   CHOLHDL 4.4 10/02/2015   Lab Results  Component Value Date   HGBA1C 5.6 10/02/2015      Assessment & Plan:   Problem List Items Addressed This Visit      Endocrine   HYPOTHYROIDISM - Primary  Discussed thyroid mgmt, discussed current dose   Relevant Orders   TSH + free T4   Lipid panel    Other Visit Diagnoses    Screen for STD (sexually transmitted disease)      Discussed std prevention   Relevant Orders   GC/Chlamydia Probe Amp   Hepatitis B surface antigen   HIV Antibody (routine testing w rflx)   RPR      No orders of the defined types were placed in this encounter.   Follow-up: No follow-ups on file.    Doristine Bosworth, MD

## 2018-12-20 LAB — RPR: RPR: NONREACTIVE

## 2018-12-20 LAB — HEPATITIS B SURFACE ANTIGEN: Hepatitis B Surface Ag: NEGATIVE

## 2018-12-20 LAB — HIV ANTIBODY (ROUTINE TESTING W REFLEX): HIV Screen 4th Generation wRfx: NONREACTIVE

## 2018-12-20 LAB — LIPID PANEL
CHOLESTEROL TOTAL: 209 mg/dL — AB (ref 100–199)
Chol/HDL Ratio: 5.4 ratio — ABNORMAL HIGH (ref 0.0–4.4)
HDL: 39 mg/dL — ABNORMAL LOW (ref >39)
LDL Calculated: 150 mg/dL — ABNORMAL HIGH (ref 0–99)
TRIGLYCERIDES: 102 mg/dL (ref 0–149)
VLDL Cholesterol Cal: 20 mg/dL (ref 5–40)

## 2018-12-20 LAB — TSH+FREE T4
Free T4: 1.24 ng/dL (ref 0.82–1.77)
TSH: 1.27 u[IU]/mL (ref 0.450–4.500)

## 2018-12-21 ENCOUNTER — Other Ambulatory Visit: Payer: Self-pay | Admitting: Family Medicine

## 2018-12-21 DIAGNOSIS — E039 Hypothyroidism, unspecified: Secondary | ICD-10-CM

## 2018-12-21 MED ORDER — LEVOTHYROXINE SODIUM 75 MCG PO TABS: ORAL_TABLET | ORAL | 1 refills | Status: DC

## 2018-12-22 LAB — GC/CHLAMYDIA PROBE AMP
Chlamydia trachomatis, NAA: NEGATIVE
Neisseria gonorrhoeae by PCR: NEGATIVE

## 2019-01-06 ENCOUNTER — Other Ambulatory Visit: Payer: Self-pay | Admitting: Family Medicine

## 2019-01-06 DIAGNOSIS — Z3041 Encounter for surveillance of contraceptive pills: Secondary | ICD-10-CM

## 2019-01-06 MED ORDER — NORETHIN-ETH ESTRAD TRIPHASIC 0.5/0.75/1-35 MG-MCG PO TABS: 1.0000 | ORAL_TABLET | Freq: Every day | ORAL | 1 refills | Status: DC

## 2019-01-06 NOTE — Telephone Encounter (Signed)
Copied from CRM [id number]. Topic: Quick Communication - Rx Refill/Question >> Jan 06, 2019 11:45 AM Herby Abraham C wrote: Medication: norethindrone-ethinyl estradiol (NORTREL 7/7/7) 0.5/0.75/1-35 MG-MCG tablet -- 90 day supply.   Has the patient contacted their pharmacy? No- pt requested in ov   (Agent: If no, request that the patient contact the pharmacy for the refill.) (Agent: If yes, when and what did the pharmacy advise?)  Preferred Pharmacy (with phone number or street name): [facility] #[id number] Ginette Otto, [place] - [address] AT [facility] OF [facility] & [address] [phone] (Phone) [phone] (Fax)    Agent: Please be advised that RX refills may take up to 3 business days. We ask that you follow-up with your pharmacy.

## 2019-01-06 NOTE — Telephone Encounter (Signed)
Appt in 5 months 90 day supply 1 RF given Requested Prescriptions  Pending Prescriptions Disp Refills  . norethindrone-ethinyl estradiol (NORTREL 7/7/7) 0.5/0.75/1-35 MG-MCG tablet 84 tablet 1    Sig: Take 1 tablet by mouth daily. Office visit needed for additional refills     OB/GYN:  Contraceptives Passed - 01/06/2019 12:07 PM      Passed - Last BP in normal range    BP Readings from Last 1 Encounters:  12/19/18 131/85         Passed - Valid encounter within last 12 months    Recent Outpatient Visits          2 weeks ago Acquired hypothyroidism   Primary Care at [facility], Manus Rudd, MD   1 year ago Flu-like symptoms   Primary Care at [facility], [place], MD   1 year ago Screen for STD (sexually transmitted disease)   Primary Care at [facility], Manus Rudd, MD   2 years ago Acquired hypothyroidism   Primary Care at [facility], [place] A, MD   3 years ago Routine screening for STI (sexually transmitted infection)   Primary Care at [facility], Gwenlyn Found, MD      Future Appointments            In 5 months Doristine Bosworth, MD Primary Care at [place], [facility]

## 2019-02-10 ENCOUNTER — Ambulatory Visit: Payer: PRIVATE HEALTH INSURANCE | Admitting: Family Medicine

## 2019-05-03 ENCOUNTER — Ambulatory Visit (INDEPENDENT_AMBULATORY_CARE_PROVIDER_SITE_OTHER): Payer: PRIVATE HEALTH INSURANCE | Admitting: Family Medicine

## 2019-05-03 ENCOUNTER — Other Ambulatory Visit: Payer: Self-pay

## 2019-05-03 ENCOUNTER — Encounter: Payer: Self-pay | Admitting: Family Medicine

## 2019-05-03 VITALS — BP 114/82 | Ht 64.25 in | Wt 218.0 lb

## 2019-05-03 DIAGNOSIS — N926 Irregular menstruation, unspecified: Secondary | ICD-10-CM

## 2019-05-03 DIAGNOSIS — N898 Other specified noninflammatory disorders of vagina: Secondary | ICD-10-CM

## 2019-05-03 LAB — POCT WET + KOH PREP
Trich by wet prep: ABSENT
Yeast by KOH: ABSENT
Yeast by wet prep: ABSENT

## 2019-05-03 LAB — POCT URINE PREGNANCY: Preg Test, Ur: NEGATIVE

## 2019-05-03 NOTE — Progress Notes (Signed)
Acute Office Visit  Subjective:    Patient ID: Krystal Gallagher, female    DOB: [birth year], 33 y.o.   MRN: [id number]  Chief Complaint  Patient presents with  . Vaginal Itching    X 1 week with some burning  . Menstrual Problem    HPI Patient is in today for vaginal discharge-concern for itching and irritation. One sexual partner. No h/o of STD. Pt missed period. No contraception-stopped pills when thyroid medication started. Pt with no odor to discharge, no color to discharge.   Family History  Problem Relation Age of Onset  . Diabetes Paternal Grandfather   . Diabetes Maternal Grandfather   . Hyperlipidemia Mother     Social History   Socioeconomic History  . Marital status: Single    Spouse name: Not on file  . Number of children: 0  . Years of education: Not on file  . Highest education level: Not on file  Occupational History  . Occupation: Magazine features editor: [employer]  Social Needs  . Financial resource strain: Not on file  . Food insecurity:    Worry: Not on file    Inability: Not on file  . Transportation needs:    Medical: Not on file    Non-medical: Not on file  Tobacco Use  . Smoking status: Never Smoker  . Smokeless tobacco: Never Used  Substance and Sexual Activity  . Alcohol use: No  . Drug use: No  . Sexual activity: Yes    Comment: number of sex partners in the last 12 months 1  Lifestyle  . Physical activity:    Days per week: Not on file    Minutes per session: Not on file  . Stress: Not on file  Relationships  . Social connections:    Talks on phone: Not on file    Gets together: Not on file    Attends religious service: Not on file    Active member of club or organization: Not on file    Attends meetings of clubs or organizations: Not on file    Relationship status: Not on file  . Intimate partner violence:    Fear of current or ex partner: Not on file    Emotionally abused: Not on file    Physically abused: Not on file   Forced sexual activity: Not on file  Other Topics Concern  . Not on file  Social History Narrative   Exercise jogging 3 times per week for 1 hour. Single. Education: [employer].    Outpatient Medications Prior to Visit  Medication Sig Dispense Refill  . fexofenadine (ALLEGRA) 180 MG tablet Take 180 mg by mouth daily.    Marland Kitchen levothyroxine (SYNTHROID, LEVOTHROID) 75 MCG tablet TAKE 1 TABLET BY MOUTH EVERY DAY. 90 tablet 1  . VITAMIN D, CHOLECALCIFEROL, PO Take by mouth daily.    . cetirizine (ZYRTEC) 10 MG tablet Take 1 tablet (10 mg total) by mouth daily. (Patient not taking: Reported on 05/03/2019) 30 tablet 11  . fluticasone (FLONASE) 50 MCG/ACT nasal spray Place 2 sprays into both nostrils daily. (Patient not taking: Reported on 05/03/2019) 16 g 6  . guaiFENesin (MUCINEX) 600 MG 12 hr tablet Take 1 tablet (600 mg total) by mouth 2 (two) times daily. (Patient not taking: Reported on 12/19/2018) 60 tablet 0  . hydrocortisone 2.5 % lotion Apply topically 2 (two) times daily. (Patient not taking: Reported on 12/27/2017) 59 mL 0  . metroNIDAZOLE (FLAGYL) 500 MG tablet Take 1  tablet (500 mg total) by mouth 2 (two) times daily. (Patient not taking: Reported on 12/27/2017) 14 tablet 0  . norethindrone-ethinyl estradiol (NORTREL 7/7/7) 0.5/0.75/1-35 MG-MCG tablet Take 1 tablet by mouth daily. Office visit needed for additional refills (Patient not taking: Reported on 05/03/2019) 84 tablet 1  . OVER THE COUNTER MEDICATION daily.     No facility-administered medications prior to visit.     No Known Allergies  ROS CONSTITUTIONAL: no weight loss, GI: no bowel changes GU:no pain with urination Missed period this month, vaginal irritation-no baths, uses soap    Objective:    Physical Exam  BP 114/82   Ht 5' 4.25" (1.632 m) Comment: per pt  Wt 218 lb (98.9 kg) Comment: per pt  LMP 01/11/2019 (Approximate)   BMI 37.13 kg/m  Wt Readings from Last 3 Encounters:  05/03/19 218 lb (98.9 kg)  12/19/18 213 lb  9.6 oz (96.9 kg)  12/27/17 189 lb 9.6 oz (86 kg)    Health Maintenance Due  Topic Date Due  . PAP SMEAR-Modifier  10/29/2018     Lab Results  Component Value Date   TSH 1.270 12/19/2018   Lab Results  Component Value Date   WBC 8.3 10/02/2015   HGB 14.1 10/02/2015   HCT 42.0 10/02/2015   MCV 83.7 10/02/2015   PLT 331 10/02/2015   Lab Results  Component Value Date   NA 136 10/02/2015   K 4.0 10/02/2015   CO2 26 10/02/2015   GLUCOSE 87 10/02/2015   BUN 7 10/02/2015   CREATININE 0.76 10/02/2015   BILITOT 0.3 10/02/2015   ALKPHOS 50 10/02/2015   AST 16 10/02/2015   ALT 18 10/02/2015   PROT 7.2 10/02/2015   ALBUMIN 4.0 10/02/2015   CALCIUM 9.2 10/02/2015   Lab Results  Component Value Date   CHOL 209 (H) 12/19/2018   Lab Results  Component Value Date   HDL 39 (L) 12/19/2018   Lab Results  Component Value Date   LDLCALC 150 (H) 12/19/2018   Lab Results  Component Value Date   TRIG 102 12/19/2018   Lab Results  Component Value Date   CHOLHDL 5.4 (H) 12/19/2018   Lab Results  Component Value Date   HGBA1C 5.6 10/02/2015       Assessment & Plan:   1. Vaginal itching - POCT Wet + KOH Prep Neg for clue cell, no yeast, no tric 2. Menstrual problem - POCT urine pregnancy Pregnancy negative Klyde Banka Mat CarneLEIGH Haniya Fern, MD

## 2019-05-03 NOTE — Patient Instructions (Signed)
° ° ° °  If you have lab work done today you will be contacted with your lab results within the next 2 weeks.  If you have not heard from us then please contact us. The fastest way to get your results is to register for My Chart. ° ° °IF you received an x-ray today, you will receive an invoice from [facility]. Please contact [facility] at [phone] with questions or concerns regarding your invoice.  ° °IF you received labwork today, you will receive an invoice from [facility]. Please contact [facility] at [phone] with questions or concerns regarding your invoice.  ° °Our billing staff will not be able to assist you with questions regarding bills from these companies. ° °You will be contacted with the lab results as soon as they are available. The fastest way to get your results is to activate your My Chart account. Instructions are located on the last page of this paperwork. If you have not heard from us regarding the results in 2 weeks, please contact this office. °  ° ° ° °

## 2019-05-04 DIAGNOSIS — N898 Other specified noninflammatory disorders of vagina: Secondary | ICD-10-CM | POA: Insufficient documentation

## 2019-05-04 DIAGNOSIS — N926 Irregular menstruation, unspecified: Secondary | ICD-10-CM | POA: Insufficient documentation

## 2019-06-26 ENCOUNTER — Ambulatory Visit (INDEPENDENT_AMBULATORY_CARE_PROVIDER_SITE_OTHER): Payer: PRIVATE HEALTH INSURANCE | Admitting: Family Medicine

## 2019-06-26 ENCOUNTER — Other Ambulatory Visit (HOSPITAL_COMMUNITY)
Admission: RE | Admit: 2019-06-26 | Discharge: 2019-06-26 | Disposition: A | Discharge status: Home / Self Care | Payer: PRIVATE HEALTH INSURANCE | Source: Ambulatory Visit | Attending: Family Medicine | Admitting: Family Medicine

## 2019-06-26 ENCOUNTER — Other Ambulatory Visit: Payer: Self-pay

## 2019-06-26 ENCOUNTER — Encounter: Payer: Self-pay | Admitting: Family Medicine

## 2019-06-26 VITALS — BP 125/86 | HR 92 | Temp 98.7°F | Resp 17 | Ht 64.25 in | Wt 212.2 lb

## 2019-06-26 DIAGNOSIS — E039 Hypothyroidism, unspecified: Secondary | ICD-10-CM | POA: Diagnosis not present

## 2019-06-26 DIAGNOSIS — Z0001 Encounter for general adult medical examination with abnormal findings: Secondary | ICD-10-CM

## 2019-06-26 DIAGNOSIS — Z124 Encounter for screening for malignant neoplasm of cervix: Secondary | ICD-10-CM | POA: Insufficient documentation

## 2019-06-26 DIAGNOSIS — E785 Hyperlipidemia, unspecified: Secondary | ICD-10-CM

## 2019-06-26 DIAGNOSIS — Z3041 Encounter for surveillance of contraceptive pills: Secondary | ICD-10-CM

## 2019-06-26 DIAGNOSIS — Z1151 Encounter for screening for human papillomavirus (HPV): Secondary | ICD-10-CM

## 2019-06-26 DIAGNOSIS — Z0000 Encounter for general adult medical examination without abnormal findings: Secondary | ICD-10-CM

## 2019-06-26 MED ORDER — NORTREL 7/7/7 0.5/0.75/1-35 MG-MCG PO TABS: 1.0000 | ORAL_TABLET | Freq: Every day | ORAL | 3 refills | Status: DC

## 2019-06-26 NOTE — Progress Notes (Signed)
Chief Complaint  Patient presents with  . Annual Exam    cpe with pap    Subjective:  Krystal Gallagher is a 33 y.o. female here for a health maintenance visit.  Patient is established pt  Patient Active Problem List   Diagnosis Date Noted  . Menstrual problem 05/04/2019  . Vaginal itching 05/04/2019  . Uses oral contraception 04/04/2013  . Abnormal Pap smear, low grade squamous intraepithelial lesion (LGSIL) 04/04/2013  . Obesity (BMI 30.0-34.9) 03/05/2013  . HYPOTHYROIDISM 04/11/2012    History reviewed. No pertinent past medical history.  History reviewed. No pertinent surgical history.   Outpatient Medications Prior to Visit  Medication Sig Dispense Refill  . norethindrone-ethinyl estradiol (NORTREL 7/7/7) 0.5/0.75/1-35 MG-MCG tablet Take 1 tablet by mouth daily. Office visit needed for additional refills 84 tablet 1  . fexofenadine (ALLEGRA) 180 MG tablet Take 180 mg by mouth daily.    Marland Kitchen levothyroxine (SYNTHROID, LEVOTHROID) 75 MCG tablet TAKE 1 TABLET BY MOUTH EVERY DAY. (Patient not taking: Reported on 06/26/2019) 90 tablet 1  . OVER THE COUNTER MEDICATION daily.    Marland Kitchen VITAMIN D, CHOLECALCIFEROL, PO Take by mouth daily.    . cetirizine (ZYRTEC) 10 MG tablet Take 1 tablet (10 mg total) by mouth daily. (Patient not taking: Reported on 05/03/2019) 30 tablet 11  . fluticasone (FLONASE) 50 MCG/ACT nasal spray Place 2 sprays into both nostrils daily. (Patient not taking: Reported on 05/03/2019) 16 g 6  . guaiFENesin (MUCINEX) 600 MG 12 hr tablet Take 1 tablet (600 mg total) by mouth 2 (two) times daily. (Patient not taking: Reported on 12/19/2018) 60 tablet 0  . hydrocortisone 2.5 % lotion Apply topically 2 (two) times daily. (Patient not taking: Reported on 12/27/2017) 59 mL 0  . metroNIDAZOLE (FLAGYL) 500 MG tablet Take 1 tablet (500 mg total) by mouth 2 (two) times daily. (Patient not taking: Reported on 12/27/2017) 14 tablet 0   No facility-administered medications prior to visit.      No Known Allergies   Family History  Problem Relation Age of Onset  . Diabetes Paternal Grandfather   . Diabetes Maternal Grandfather   . Hyperlipidemia Mother       Social History   Socioeconomic History  . Marital status: Single    Spouse name: Not on file  . Number of children: 0  . Years of education: Not on file  . Highest education level: Not on file  Occupational History  . Occupation: Product manager: [employer]  Social Needs  . Financial resource strain: Not on file  . Food insecurity    Worry: Not on file    Inability: Not on file  . Transportation needs    Medical: Not on file    Non-medical: Not on file  Tobacco Use  . Smoking status: Never Smoker  . Smokeless tobacco: Never Used  Substance and Sexual Activity  . Alcohol use: No  . Drug use: No  . Sexual activity: Yes    Comment: number of sex partners in the last 12 months 1  Lifestyle  . Physical activity    Days per week: Not on file    Minutes per session: Not on file  . Stress: Not on file  Relationships  . Social Herbalist on phone: Not on file    Gets together: Not on file    Attends religious service: Not on file    Active member of club or organization: Not on file  Attends meetings of clubs or organizations: Not on file    Relationship status: Not on file  . Intimate partner violence    Fear of current or ex partner: Not on file    Emotionally abused: Not on file    Physically abused: Not on file    Forced sexual activity: Not on file  Other Topics Concern  . Not on file  Social History Narrative   Exercise jogging 3 times per week for 1 hour. Single. Education: [employer].   Social History   Substance and Sexual Activity  Alcohol Use No   Social History   Tobacco Use  Smoking Status Never Smoker  Smokeless Tobacco Never Used   Social History   Substance and Sexual Activity  Drug Use No    GYN: Sexual Health Menstrual status: regular menses  LMP: Patient's last menstrual period was 05/30/2019. Last pap smear: see HM section History of abnormal pap smears:  Sexually active: with female partner Current contraception: ocps  Health Maintenance: See under health Maintenance activity for review of completion dates as well. Immunization History  Administered Date(s) Administered  . Influenza Split 09/30/2012  . Influenza,inj,Quad PF,6+ Mos 09/15/2013, 10/19/2014  . Tdap 03/24/2012      Depression Screen-PHQ2/9 [facility] screen [facility] 06/26/2019 05/03/2019 12/19/2018 12/27/2017 10/25/2017  Decreased Interest 0 0 0 0 0  Down, Depressed, Hopeless 0 0 0 0 0  PHQ - 2 Score 0 0 0 0 0     Depression Severity and Treatment Recommendations:  0-4= None  5-9= Mild / Treatment: Support, educate to call if worse; return in one month  10-14= Moderate / Treatment: Support, watchful waiting; Antidepressant or Psycotherapy  15-19= Moderately severe / Treatment: Antidepressant OR Psychotherapy  >= 20 = Major depression, severe / Antidepressant AND Psychotherapy    Review of Systems   ROS  See HPI for ROS as well.  Review of Systems  Constitutional: Negative for activity change, appetite change, chills and fever.  HENT: Negative for congestion, nosebleeds, trouble swallowing and voice change.   Respiratory: Negative for cough, shortness of breath and wheezing.   Gastrointestinal: Negative for diarrhea, nausea and vomiting.  Genitourinary: Negative for difficulty urinating, dysuria, flank pain and hematuria.  Musculoskeletal: Negative for back pain, joint swelling and neck pain.  Neurological: Negative for dizziness, speech difficulty, light-headedness and numbness.  See HPI. All other review of systems negative.    Objective:   Vitals:   06/26/19 0811  BP: 125/86  Pulse: 92  Resp: 17  Temp: 98.7 F (37.1 C)  TempSrc: Oral  SpO2: 97%  Weight: 212 lb 3.2 oz (96.3 kg)  Height: 5' 4.25" (1.632 m)   Wt Readings from Last 3  Encounters:  06/26/19 212 lb 3.2 oz (96.3 kg)  05/03/19 218 lb (98.9 kg)  12/19/18 213 lb 9.6 oz (96.9 kg)     Body mass index is 36.14 kg/m.  Physical Exam  BP 125/86 (BP Location: Left Arm, Patient Position: Sitting, Cuff Size: Large)   Pulse 92   Temp 98.7 F (37.1 C) (Oral)   Resp 17   Ht 5' 4.25" (1.632 m)   Wt 212 lb 3.2 oz (96.3 kg)   LMP 05/30/2019   SpO2 97%   BMI 36.14 kg/m   Chaperone present General Appearance:    Alert, cooperative, no distress, appears stated age  Head:    Normocephalic, without obvious abnormality, atraumatic  Eyes:    PERRL, conjunctiva/corneas clear, EOM's intact, fundi    benign,  both eyes  Ears:    Normal TM's and external ear canals, both ears  Nose:   Nares normal, septum midline, mucosa normal, no drainage    or sinus tenderness  Throat:   Lips, mucosa, and tongue normal; teeth and gums normal  Neck:   Supple, symmetrical, trachea midline, no adenopathy;    thyroid:  no enlargement/tenderness/nodules; no carotid   bruit or JVD  Back:     Symmetric, no curvature, ROM normal, no CVA tenderness  Lungs:     Clear to auscultation bilaterally, respirations unlabored  Chest Wall:    No tenderness or deformity   Heart:    Regular rate and rhythm, S1 and S2 normal, no murmur, rub   or gallop  Breast Exam:    No tenderness, masses, or nipple abnormality  Abdomen:     Soft, non-tender, bowel sounds active all four quadrants,    no masses, no organomegaly  Genitalia:    Normal female without lesion, discharge or tenderness, no CMT, no ovarian masses, pap smear performed  Extremities:   Extremities normal, atraumatic, no cyanosis or edema  Pulses:   2+ and symmetric all extremities  Skin:   Skin color, texture, turgor normal, no rashes or lesions  Lymph nodes:   Cervical, supraclavicular, and axillary nodes normal  Neurologic:   CNII-XII intact, normal strength, sensation and reflexes    throughout      Assessment/Plan:   Patient was  seen for a health maintenance exam.  Counseled the patient on health maintenance issues. Reviewed her health mainteance schedule and ordered appropriate tests (see orders.) Counseled on regular exercise and weight management. Recommend regular eye exams and dental cleaning.   The following issues were addressed today for health maintenance:   Krystal Gallagher was seen today for annual exam.  Diagnoses and all orders for this visit:  Encounter for health maintenance examination in adult- Women's Health Maintenance Plan Advised monthly breast exam and annual mammogram Advised dental exam every six months Discussed stress management Discussed pap smear screening guidelines  Screening for cervical cancer -     Cytology - PAP([place])  Acquired hypothyroidism -     TSH  Dyslipidemia -     Lipid panel  Screening for human papillomavirus (HPV)  Oral contraceptive pill surveillance -     norethindrone-ethinyl estradiol (NORTREL 7/7/7) 0.5/0.75/1-35 MG-MCG tablet; Take 1 tablet by mouth daily.    Return in about 1 year (around 06/25/2020).    Body mass index is 36.14 kg/m.:  Discussed the patient's BMI with patient. The BMI body mass index is 36.14 kg/m.     No future appointments.  Patient Instructions       If you have lab work done today you will be contacted with your lab results within the next 2 weeks.  If you have not heard from [place] then please contact us. The fastest way to get your results is to register for My Chart.   IF you received an x-ray today, you will receive an invoice from [facility]. Please contact [facility] at [phone] with questions or concerns regarding your invoice.   IF you received labwork today, you will receive an invoice from [facility]. Please contact [facility] at [phone] with questions or concerns regarding your invoice.   Our billing staff will not be able to assist you with questions regarding bills from these  companies.  You will be contacted with the lab results as soon as they are available. The fastest way to  get your results is to activate your My Chart account. Instructions are located on the last page of this paperwork. If you have not heard from us regarding the results in 2 weeks, please contact this office.     Health Maintenance, Female Adopting a healthy lifestyle and getting preventive care are important in promoting health and wellness. Ask your health care provider about:  The right schedule for you to have regular tests and exams.  Things you can do on your own to prevent diseases and keep yourself healthy. What should I know about diet, weight, and exercise? Eat a healthy diet   Eat a diet that includes plenty of vegetables, fruits, low-fat dairy products, and lean protein.  Do not eat a lot of foods that are high in solid fats, added sugars, or sodium. Maintain a healthy weight Body mass index (BMI) is used to identify weight problems. It estimates body fat based on height and weight. Your health care provider can help determine your BMI and help you achieve or maintain a healthy weight. Get regular exercise Get regular exercise. This is one of the most important things you can do for your health. Most adults should:  Exercise for at least 150 minutes each week. The exercise should increase your heart rate and make you sweat (moderate-intensity exercise).  Do strengthening exercises at least twice a week. This is in addition to the moderate-intensity exercise.  Spend less time sitting. Even light physical activity can be beneficial. Watch cholesterol and blood lipids Have your blood tested for lipids and cholesterol at 33 years of age, then have this test every 5 years. Have your cholesterol levels checked more often if:  Your lipid or cholesterol levels are high.  You are older than 33 years of age.  You are at high risk for heart disease. What should I know about  cancer screening? Depending on your health history and family history, you may need to have cancer screening at various ages. This may include screening for:  Breast cancer.  Cervical cancer.  Colorectal cancer.  Skin cancer.  Lung cancer. What should I know about heart disease, diabetes, and high blood pressure? Blood pressure and heart disease  High blood pressure causes heart disease and increases the risk of stroke. This is more likely to develop in people who have high blood pressure readings, are of African descent, or are overweight.  Have your blood pressure checked: ? Every 3-5 years if you are 7418-33 years of age. ? Every year if you are 531 years old or older. Diabetes Have regular diabetes screenings. This checks your fasting blood sugar level. Have the screening done:  Once every three years after age 33 if you are at a normal weight and have a low risk for diabetes.  More often and at a younger age if you are overweight or have a high risk for diabetes. What should I know about preventing infection? Hepatitis B If you have a higher risk for hepatitis B, you should be screened for this virus. Talk with your health care provider to find out if you are at risk for hepatitis B infection. Hepatitis C Testing is recommended for:  Everyone born from [id number] through 1965.  Anyone with known risk factors for hepatitis C. Sexually transmitted infections (STIs)  Get screened for STIs, including gonorrhea and chlamydia, if: ? You are sexually active and are younger than 33 years of age. ? You are older than 33 years of age and your health  care provider tells you that you are at risk for this type of infection. ? Your sexual activity has changed since you were last screened, and you are at increased risk for chlamydia or gonorrhea. Ask your health care provider if you are at risk.  Ask your health care provider about whether you are at high risk for HIV. Your health care  provider may recommend a prescription medicine to help prevent HIV infection. If you choose to take medicine to prevent HIV, you should first get tested for HIV. You should then be tested every 3 months for as long as you are taking the medicine. Pregnancy  If you are about to stop having your period (premenopausal) and you may become pregnant, seek counseling before you get pregnant.  Take 400 to 800 micrograms (mcg) of folic acid every day if you become pregnant.  Ask for birth control (contraception) if you want to prevent pregnancy. Osteoporosis and menopause Osteoporosis is a disease in which the bones lose minerals and strength with aging. This can result in bone fractures. If you are 33 years old or older, or if you are at risk for osteoporosis and fractures, ask your health care provider if you should:  Be screened for bone loss.  Take a calcium or vitamin D supplement to lower your risk of fractures.  Be given hormone replacement therapy (HRT) to treat symptoms of menopause. Follow these instructions at home: Lifestyle  Do not use any products that contain nicotine or tobacco, such as cigarettes, e-cigarettes, and chewing tobacco. If you need help quitting, ask your health care provider.  Do not use street drugs.  Do not share needles.  Ask your health care provider for help if you need support or information about quitting drugs. Alcohol use  Do not drink alcohol if: ? Your health care provider tells you not to drink. ? You are pregnant, may be pregnant, or are planning to become pregnant.  If you drink alcohol: ? Limit how much you use to 0-1 drink a day. ? Limit intake if you are breastfeeding.  Be aware of how much alcohol is in your drink. In the [place] one drink equals one 12 oz bottle of beer (355 mL), one 5 oz glass of wine (148 mL), or one 1 oz glass of hard liquor (44 mL). General instructions  Schedule regular health, dental, and eye exams.  Stay current  with your vaccines.  Tell your health care provider if: ? You often feel depressed. ? You have ever been abused or do not feel safe at home. Summary  Adopting a healthy lifestyle and getting preventive care are important in promoting health and wellness.  Follow your health care provider's instructions about healthy diet, exercising, and getting tested or screened for diseases.  Follow your health care provider's instructions on monitoring your cholesterol and blood pressure. This information is not intended to replace advice given to you by your health care provider. Make sure you discuss any questions you have with your health care provider. Document Released: 06/01/2011 Document Revised: 11/09/2018 Document Reviewed: 11/09/2018 Elsevier Patient Education  2020 [employer]

## 2019-06-26 NOTE — Patient Instructions (Addendum)
   If you have lab work done today you will be contacted with your lab results within the next 2 weeks.  If you have not heard from us then please contact us. The fastest way to get your results is to register for My Chart.   IF you received an x-ray today, you will receive an invoice from [facility]. Please contact [facility] at [phone] with questions or concerns regarding your invoice.   IF you received labwork today, you will receive an invoice from [facility]. Please contact [facility] at [phone] with questions or concerns regarding your invoice.   Our billing staff will not be able to assist you with questions regarding bills from these companies.  You will be contacted with the lab results as soon as they are available. The fastest way to get your results is to activate your My Chart account. Instructions are located on the last page of this paperwork. If you have not heard from us regarding the results in 2 weeks, please contact this office.      Health Maintenance, Female Adopting a healthy lifestyle and getting preventive care are important in promoting health and wellness. Ask your health care provider about:  The right schedule for you to have regular tests and exams.  Things you can do on your own to prevent diseases and keep yourself healthy. What should I know about diet, weight, and exercise? Eat a healthy diet   Eat a diet that includes plenty of vegetables, fruits, low-fat dairy products, and lean protein.  Do not eat a lot of foods that are high in solid fats, added sugars, or sodium. Maintain a healthy weight Body mass index (BMI) is used to identify weight problems. It estimates body fat based on height and weight. Your health care provider can help determine your BMI and help you achieve or maintain a healthy weight. Get regular exercise Get regular exercise. This is one of the most important things you can do for your health. Most  adults should:  Exercise for at least 150 minutes each week. The exercise should increase your heart rate and make you sweat (moderate-intensity exercise).  Do strengthening exercises at least twice a week. This is in addition to the moderate-intensity exercise.  Spend less time sitting. Even light physical activity can be beneficial. Watch cholesterol and blood lipids Have your blood tested for lipids and cholesterol at 33 years of age, then have this test every 5 years. Have your cholesterol levels checked more often if:  Your lipid or cholesterol levels are high.  You are older than 33 years of age.  You are at high risk for heart disease. What should I know about cancer screening? Depending on your health history and family history, you may need to have cancer screening at various ages. This may include screening for:  Breast cancer.  Cervical cancer.  Colorectal cancer.  Skin cancer.  Lung cancer. What should I know about heart disease, diabetes, and high blood pressure? Blood pressure and heart disease  High blood pressure causes heart disease and increases the risk of stroke. This is more likely to develop in people who have high blood pressure readings, are of African descent, or are overweight.  Have your blood pressure checked: ? Every 3-5 years if you are 18-39 years of age. ? Every year if you are 40 years old or older. Diabetes Have regular diabetes screenings. This checks your fasting blood sugar level. Have the screening done:  Once every   three years after age 40 if you are at a normal weight and have a low risk for diabetes.  More often and at a younger age if you are overweight or have a high risk for diabetes. What should I know about preventing infection? Hepatitis B If you have a higher risk for hepatitis B, you should be screened for this virus. Talk with your health care provider to find out if you are at risk for hepatitis B infection. Hepatitis  C Testing is recommended for:  Everyone born from 1945 through 1965.  Anyone with known risk factors for hepatitis C. Sexually transmitted infections (STIs)  Get screened for STIs, including gonorrhea and chlamydia, if: ? You are sexually active and are younger than 33 years of age. ? You are older than 33 years of age and your health care provider tells you that you are at risk for this type of infection. ? Your sexual activity has changed since you were last screened, and you are at increased risk for chlamydia or gonorrhea. Ask your health care provider if you are at risk.  Ask your health care provider about whether you are at high risk for HIV. Your health care provider may recommend a prescription medicine to help prevent HIV infection. If you choose to take medicine to prevent HIV, you should first get tested for HIV. You should then be tested every 3 months for as long as you are taking the medicine. Pregnancy  If you are about to stop having your period (premenopausal) and you may become pregnant, seek counseling before you get pregnant.  Take 400 to 800 micrograms (mcg) of folic acid every day if you become pregnant.  Ask for birth control (contraception) if you want to prevent pregnancy. Osteoporosis and menopause Osteoporosis is a disease in which the bones lose minerals and strength with aging. This can result in bone fractures. If you are 65 years old or older, or if you are at risk for osteoporosis and fractures, ask your health care provider if you should:  Be screened for bone loss.  Take a calcium or vitamin D supplement to lower your risk of fractures.  Be given hormone replacement therapy (HRT) to treat symptoms of menopause. Follow these instructions at home: Lifestyle  Do not use any products that contain nicotine or tobacco, such as cigarettes, e-cigarettes, and chewing tobacco. If you need help quitting, ask your health care provider.  Do not use street  drugs.  Do not share needles.  Ask your health care provider for help if you need support or information about quitting drugs. Alcohol use  Do not drink alcohol if: ? Your health care provider tells you not to drink. ? You are pregnant, may be pregnant, or are planning to become pregnant.  If you drink alcohol: ? Limit how much you use to 0-1 drink a day. ? Limit intake if you are breastfeeding.  Be aware of how much alcohol is in your drink. In the [place] one drink equals one 12 oz bottle of beer (355 mL), one 5 oz glass of wine (148 mL), or one 1 oz glass of hard liquor (44 mL). General instructions  Schedule regular health, dental, and eye exams.  Stay current with your vaccines.  Tell your health care provider if: ? You often feel depressed. ? You have ever been abused or do not feel safe at home. Summary  Adopting a healthy lifestyle and getting preventive care are important in promoting health and wellness.    Follow your health care provider's instructions about healthy diet, exercising, and getting tested or screened for diseases.  Follow your health care provider's instructions on monitoring your cholesterol and blood pressure. This information is not intended to replace advice given to you by your health care provider. Make sure you discuss any questions you have with your health care provider. Document Released: 06/01/2011 Document Revised: 11/09/2018 Document Reviewed: 11/09/2018 Elsevier Patient Education  2020 Elsevier Inc.  

## 2019-06-27 LAB — TSH: TSH: 4.39 u[IU]/mL (ref 0.450–4.500)

## 2019-06-27 LAB — LIPID PANEL
Chol/HDL Ratio: 5.4 ratio — ABNORMAL HIGH (ref 0.0–4.4)
Cholesterol, Total: 201 mg/dL — ABNORMAL HIGH (ref 100–199)
HDL: 37 mg/dL — ABNORMAL LOW (ref >39)
LDL Calculated: 137 mg/dL — ABNORMAL HIGH (ref 0–99)
Triglycerides: 133 mg/dL (ref 0–149)
VLDL Cholesterol Cal: 27 mg/dL (ref 5–40)

## 2019-06-28 LAB — CYTOLOGY - PAP
Chlamydia: NEGATIVE
Diagnosis: NEGATIVE
HPV: NOT DETECTED
Neisseria Gonorrhea: NEGATIVE

## 2019-06-28 MED ORDER — LEVOTHYROXINE SODIUM 75 MCG PO TABS: ORAL_TABLET | ORAL | 3 refills | Status: DC

## 2020-06-17 ENCOUNTER — Encounter: Payer: Self-pay | Admitting: Registered Nurse

## 2020-06-17 ENCOUNTER — Ambulatory Visit (INDEPENDENT_AMBULATORY_CARE_PROVIDER_SITE_OTHER): Payer: BC Managed Care – PPO | Admitting: Registered Nurse

## 2020-06-17 ENCOUNTER — Other Ambulatory Visit: Payer: Self-pay

## 2020-06-17 VITALS — BP 114/83 | HR 88 | Temp 97.8°F | Resp 16 | Ht 64.25 in | Wt 209.4 lb

## 2020-06-17 DIAGNOSIS — N926 Irregular menstruation, unspecified: Secondary | ICD-10-CM

## 2020-06-17 DIAGNOSIS — Z3041 Encounter for surveillance of contraceptive pills: Secondary | ICD-10-CM | POA: Diagnosis not present

## 2020-06-17 DIAGNOSIS — E039 Hypothyroidism, unspecified: Secondary | ICD-10-CM | POA: Diagnosis not present

## 2020-06-17 MED ORDER — LEVOTHYROXINE SODIUM 75 MCG PO TABS: ORAL_TABLET | ORAL | 3 refills | Status: DC

## 2020-06-17 MED ORDER — NORTREL 7/7/7 0.5/0.75/1-35 MG-MCG PO TABS: 1.0000 | ORAL_TABLET | Freq: Every day | ORAL | 3 refills | Status: DC

## 2020-06-17 NOTE — Patient Instructions (Signed)
° ° ° °  If you have lab work done today you will be contacted with your lab results within the next 2 weeks.  If you have not heard from us then please contact us. The fastest way to get your results is to register for My Chart. ° ° °IF you received an x-ray today, you will receive an invoice from [facility]. Please contact [facility] at [phone] with questions or concerns regarding your invoice.  ° °IF you received labwork today, you will receive an invoice from [facility]. Please contact [facility] at [phone] with questions or concerns regarding your invoice.  ° °Our billing staff will not be able to assist you with questions regarding bills from these companies. ° °You will be contacted with the lab results as soon as they are available. The fastest way to get your results is to activate your My Chart account. Instructions are located on the last page of this paperwork. If you have not heard from us regarding the results in 2 weeks, please contact this office. °  ° ° ° °

## 2020-06-17 NOTE — Progress Notes (Signed)
Established Patient Office Visit  Subjective:  Patient ID: Krystal Gallagher, female    DOB: 09/02/1986  Age: 34 y.o. MRN: [id number]  CC:  Chief Complaint  Patient presents with  . Hypothyroidism    pt is here today to have her Thyroid checkked and renew her levothyroxine  . Contraception    pt also needs renewal of her BC pt has had no side effects normal menses, no concern for pregnancy at this time     HPI Krystal Gallagher presents for thyroid and bcm  Thyroid: acquired hypothyroidism. Taking synthroid po qd. Good effect. Denies any symptoms of hypo or hyperthyroidism. Hoping to continue  Bcm: taking COCs. Unfortunately has run out within the past month. Has had [facility] in that time. Will wait until normal menses to resume COCs. If concerned for pregnancy will return to clinic. [facility] within past week or would run test today - concern for false negative due to early test. Reminded patient that no [facility] within 1 week after starting BCM.  Otherwise no concerns, feeling well overall. Wants labs. Will reestablish with new provider in our office in the future.   No past medical history on file.  No past surgical history on file.  Family History  Problem Relation Age of Onset  . Diabetes Paternal Grandfather   . Diabetes Maternal Grandfather   . Hyperlipidemia Mother     Social History   Socioeconomic History  . Marital status: Single    Spouse name: Not on file  . Number of children: 0  . Years of education: Not on file  . Highest education level: Not on file  Occupational History  . Occupation: Magazine features editor: [employer]  Tobacco Use  . Smoking status: Never Smoker  . Smokeless tobacco: Never Used  Vaping Use  . Vaping Use: Never used  Substance and Sexual Activity  . Alcohol use: No  . Drug use: No  . Sexual activity: Yes    Comment: number of sex partners in the last 12 months 1  Other Topics Concern  . Not on file  Social History Narrative   Exercise  jogging 3 times per week for 1 hour. Single. Education: [employer].   Social Determinants of Health   Financial Resource Strain:   . Difficulty of Paying Living Expenses:   Food Insecurity:   . Worried About Programme researcher, broadcasting/film/video in the Last Year:   . Barista in the Last Year:   Transportation Needs:   . Freight forwarder (Medical):   Marland Kitchen Lack of Transportation (Non-Medical):   Physical Activity:   . Days of Exercise per Week:   . Minutes of Exercise per Session:   Stress:   . Feeling of Stress :   Social Connections:   . Frequency of Communication with Friends and Family:   . Frequency of Social Gatherings with Friends and Family:   . Attends Religious Services:   . Active Member of Clubs or Organizations:   . Attends Banker Meetings:   Marland Kitchen Marital Status:   Intimate Partner Violence:   . Fear of Current or Ex-Partner:   . Emotionally Abused:   Marland Kitchen Physically Abused:   . Sexually Abused:     Outpatient Medications Prior to Visit  Medication Sig Dispense Refill  . fexofenadine (ALLEGRA) 180 MG tablet Take 180 mg by mouth daily.    Marland Kitchen OVER THE COUNTER MEDICATION daily.    Marland Kitchen VITAMIN D, CHOLECALCIFEROL, PO  Take by mouth daily.    Marland Kitchen levothyroxine (SYNTHROID) 75 MCG tablet TAKE 1 TABLET BY MOUTH EVERY DAY. 90 tablet 3  . norethindrone-ethinyl estradiol (NORTREL 7/7/7) 0.5/0.75/1-35 MG-MCG tablet Take 1 tablet by mouth daily. 84 tablet 3   No facility-administered medications prior to visit.    No Known Allergies  ROS Review of Systems  Constitutional: Negative.   HENT: Negative.   Eyes: Negative.   Respiratory: Negative.   Cardiovascular: Negative.   Gastrointestinal: Negative.   Endocrine: Negative.   Genitourinary: Negative.   Musculoskeletal: Negative.   Skin: Negative.   Allergic/Immunologic: Negative.   Neurological: Negative.   Hematological: Negative.   Psychiatric/Behavioral: Negative.   All other systems reviewed and are negative.       Objective:    Physical Exam Vitals and nursing note reviewed.  Constitutional:      Appearance: Normal appearance. She is normal weight.  Cardiovascular:     Rate and Rhythm: Normal rate and regular rhythm.  Pulmonary:     Effort: Pulmonary effort is normal.  Skin:    General: Skin is warm and dry.  Neurological:     General: No focal deficit present.     Mental Status: She is alert and oriented to person, place, and time. Mental status is at baseline.  Psychiatric:        Mood and Affect: Mood normal.        Behavior: Behavior normal.        Thought Content: Thought content normal.        Judgment: Judgment normal.     BP 114/83   Pulse 88   Temp 97.8 F (36.6 C) (Temporal)   Resp 16   Ht 5' 4.25" (1.632 m)   Wt 209 lb 6.4 oz (95 kg)   SpO2 97%   BMI 35.66 kg/m  Wt Readings from Last 3 Encounters:  06/17/20 209 lb 6.4 oz (95 kg)  06/26/19 212 lb 3.2 oz (96.3 kg)  05/03/19 218 lb (98.9 kg)     There are no preventive care reminders to display for this patient.  There are no preventive care reminders to display for this patient.  Lab Results  Component Value Date   TSH 4.390 06/26/2019   Lab Results  Component Value Date   WBC 8.3 10/02/2015   HGB 14.1 10/02/2015   HCT 42.0 10/02/2015   MCV 83.7 10/02/2015   PLT 331 10/02/2015   Lab Results  Component Value Date   NA 136 10/02/2015   K 4.0 10/02/2015   CO2 26 10/02/2015   GLUCOSE 87 10/02/2015   BUN 7 10/02/2015   CREATININE 0.76 10/02/2015   BILITOT 0.3 10/02/2015   ALKPHOS 50 10/02/2015   AST 16 10/02/2015   ALT 18 10/02/2015   PROT 7.2 10/02/2015   ALBUMIN 4.0 10/02/2015   CALCIUM 9.2 10/02/2015   Lab Results  Component Value Date   CHOL 201 (H) 06/26/2019   Lab Results  Component Value Date   HDL 37 (L) 06/26/2019   Lab Results  Component Value Date   LDLCALC 137 (H) 06/26/2019   Lab Results  Component Value Date   TRIG 133 06/26/2019   Lab Results  Component Value Date    CHOLHDL 5.4 (H) 06/26/2019   Lab Results  Component Value Date   HGBA1C 5.6 10/02/2015      Assessment & Plan:   Problem List Items Addressed This Visit      Endocrine   HYPOTHYROIDISM - Primary  Relevant Medications   levothyroxine (SYNTHROID) 75 MCG tablet   Other Relevant Orders   Thyroid Panel With TSH     Other   Menstrual problem   Relevant Orders   CBC with Differential/Platelet    Other Visit Diagnoses    Oral contraceptive pill surveillance       Relevant Medications   norethindrone-ethinyl estradiol (NORTREL 7/7/7) 0.5/0.75/1-35 MG-MCG tablet   Other Relevant Orders   CBC with Differential/Platelet      Meds ordered this encounter  Medications  . levothyroxine (SYNTHROID) 75 MCG tablet    Sig: TAKE 1 TABLET BY MOUTH EVERY DAY.    Dispense:  90 tablet    Refill:  3    Order Specific Question:   Supervising Provider    Answer:   Neva Seat, JEFFREY R [[id number]]  . norethindrone-ethinyl estradiol (NORTREL 7/7/7) 0.5/0.75/1-35 MG-MCG tablet    Sig: Take 1 tablet by mouth daily.    Dispense:  84 tablet    Refill:  3    Order Specific Question:   Supervising Provider    Answer:   Neva Seat, JEFFREY R [[id number]]    Follow-up: No follow-ups on file.   PLAN  Refill levothyroxine. Will adjust dose of TSH / thyroid panel returns abnormal.  Refill bcm. Start on Sunday of next menses. No upic until then or for 1 week after starting. If normal menses does not start return to office for urine preg test.  Return in 1 year at latest, sooner if labs or other concerns warrant  Patient encouraged to call clinic with any questions, comments, or concerns.  Krystal Agee, NP

## 2020-06-18 ENCOUNTER — Encounter: Payer: Self-pay | Admitting: Registered Nurse

## 2020-06-18 DIAGNOSIS — E039 Hypothyroidism, unspecified: Secondary | ICD-10-CM

## 2020-06-18 LAB — CBC WITH DIFFERENTIAL/PLATELET
Basophils Absolute: 0.1 10*3/uL (ref 0.0–0.2)
Basos: 1 %
EOS (ABSOLUTE): 0.3 10*3/uL (ref 0.0–0.4)
Eos: 4 %
Hematocrit: 47.5 % — ABNORMAL HIGH (ref 34.0–46.6)
Hemoglobin: 15.5 g/dL (ref 11.1–15.9)
Immature Grans (Abs): 0 10*3/uL (ref 0.0–0.1)
Immature Granulocytes: 0 %
Lymphocytes Absolute: 2.9 10*3/uL (ref 0.7–3.1)
Lymphs: 40 %
MCH: 27.7 pg (ref 26.6–33.0)
MCHC: 32.6 g/dL (ref 31.5–35.7)
MCV: 85 fL (ref 79–97)
Monocytes Absolute: 0.4 10*3/uL (ref 0.1–0.9)
Monocytes: 5 %
Neutrophils Absolute: 3.6 10*3/uL (ref 1.4–7.0)
Neutrophils: 50 %
Platelets: 334 10*3/uL (ref 150–450)
RBC: 5.59 x10E6/uL — ABNORMAL HIGH (ref 3.77–5.28)
RDW: 14 % (ref 11.7–15.4)
WBC: 7.3 10*3/uL (ref 3.4–10.8)

## 2020-06-18 LAB — THYROID PANEL WITH TSH
Free Thyroxine Index: 2.4 (ref 1.2–4.9)
T3 Uptake Ratio: 26 % (ref 24–39)
T4, Total: 9.3 ug/dL (ref 4.5–12.0)
TSH: 0.254 u[IU]/mL — ABNORMAL LOW (ref 0.450–4.500)

## 2020-06-18 MED ORDER — LEVOTHYROXINE SODIUM 50 MCG PO TABS: 50.0000 ug | ORAL_TABLET | Freq: Every day | ORAL | 0 refills | Status: DC

## 2020-07-02 ENCOUNTER — Other Ambulatory Visit: Payer: Self-pay

## 2020-07-02 ENCOUNTER — Ambulatory Visit (INDEPENDENT_AMBULATORY_CARE_PROVIDER_SITE_OTHER): Payer: BC Managed Care – PPO | Admitting: Registered Nurse

## 2020-07-02 ENCOUNTER — Encounter: Payer: Self-pay | Admitting: Registered Nurse

## 2020-07-02 ENCOUNTER — Telehealth: Payer: Self-pay

## 2020-07-02 VITALS — BP 104/72 | HR 77 | Temp 97.4°F | Resp 18 | Ht 64.25 in | Wt 211.2 lb

## 2020-07-02 DIAGNOSIS — Z111 Encounter for screening for respiratory tuberculosis: Secondary | ICD-10-CM

## 2020-07-02 DIAGNOSIS — Z021 Encounter for pre-employment examination: Secondary | ICD-10-CM | POA: Diagnosis not present

## 2020-07-02 LAB — TB SKIN TEST

## 2020-07-02 NOTE — Patient Instructions (Signed)
° ° ° °  If you have lab work done today you will be contacted with your lab results within the next 2 weeks.  If you have not heard from us then please contact us. The fastest way to get your results is to register for My Chart. ° ° °IF you received an x-ray today, you will receive an invoice from [facility]. Please contact [facility] at [phone] with questions or concerns regarding your invoice.  ° °IF you received labwork today, you will receive an invoice from [facility]. Please contact [facility] at [phone] with questions or concerns regarding your invoice.  ° °Our billing staff will not be able to assist you with questions regarding bills from these companies. ° °You will be contacted with the lab results as soon as they are available. The fastest way to get your results is to activate your My Chart account. Instructions are located on the last page of this paperwork. If you have not heard from us regarding the results in 2 weeks, please contact this office. °  ° ° ° °

## 2020-07-02 NOTE — Progress Notes (Signed)
Established Patient Office Visit  Subjective:  Patient ID: Krystal Gallagher, female    DOB: [birth year]  Age: 34 y.o. MRN: [id number]  CC:  Chief Complaint  Patient presents with  . 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  No concerns or complaints.  Needs TB skin test. Will return for read on Thursday.   No past medical history on file.  No past surgical history on file.  Family History  Problem Relation Age of Onset  . Diabetes Paternal Grandfather   . Diabetes Maternal Grandfather   . Hyperlipidemia Mother     Social History   Socioeconomic History  . Marital status: Single    Spouse name: Not on file  . Number of children: 0  . Years of education: Not on file  . Highest education level: Not on file  Occupational History  . Occupation: Magazine features editor: [employer]  Tobacco Use  . Smoking status: Never Smoker  . Smokeless tobacco: Never Used  Vaping Use  . Vaping Use: Never used  Substance and Sexual Activity  . Alcohol use: No  . Drug use: No  . Sexual activity: Yes    Comment: number of sex partners in the last 12 months 1  Other Topics Concern  . Not on file  Social History Narrative   Exercise jogging 3 times per week for 1 hour. Single. Education: [employer].   Social Determinants of Health   Financial Resource Strain:   . Difficulty of Paying Living Expenses:   Food Insecurity:   . Worried About Programme researcher, broadcasting/film/video in the Last Year:   . Barista in the Last Year:   Transportation Needs:   . Freight forwarder (Medical):   Marland Kitchen Lack of Transportation (Non-Medical):   Physical Activity:   . Days of Exercise per Week:   .  Minutes of Exercise per Session:   Stress:   . Feeling of Stress :   Social Connections:   . Frequency of Communication with Friends and Family:   . Frequency of Social Gatherings with Friends and Family:   . Attends Religious Services:   . Active Member of Clubs or Organizations:   . Attends Banker Meetings:   Marland Kitchen Marital Status:   Intimate Partner Violence:   . Fear of Current or Ex-Partner:   . Emotionally Abused:   Marland Kitchen Physically Abused:   . Sexually Abused:     Outpatient Medications Prior to Visit  Medication Sig Dispense Refill  . fexofenadine (ALLEGRA) 180 MG tablet Take 180 mg by mouth daily.    Marland Kitchen levothyroxine (SYNTHROID) 50 MCG tablet Take 1 tablet (50 mcg total) by mouth daily before breakfast. 90 tablet 0  . norethindrone-ethinyl estradiol (NORTREL 7/7/7) 0.5/0.75/1-35 MG-MCG tablet Take 1 tablet by mouth daily. 84 tablet 3  . OVER THE COUNTER MEDICATION daily.    Marland Kitchen VITAMIN D, CHOLECALCIFEROL, PO Take by mouth daily.     No facility-administered medications prior to visit.    No Known Allergies  ROS Review of Systems negative   Objective:    Physical Exam Vitals and nursing note reviewed.  Constitutional:      General: She is not in acute distress.    Appearance: Normal appearance. She is normal weight. She is not ill-appearing, toxic-appearing or diaphoretic.  Cardiovascular:     Rate and Rhythm: Normal rate and regular rhythm.     Pulses: Normal pulses.     Heart sounds: Normal heart sounds. No murmur heard.  No friction rub. No gallop.   Pulmonary:     Effort: Pulmonary effort is normal. No respiratory distress.     Breath sounds: Normal breath sounds. No stridor. No wheezing, rhonchi or rales.  Chest:     Chest wall: No tenderness.  Musculoskeletal:        General: No swelling, tenderness, deformity or signs of injury. Normal range of motion.     Right lower leg: No edema.     Left lower leg: No edema.  Skin:    General: Skin is warm  and dry.     Capillary Refill: Capillary refill takes less than 2 seconds.  Neurological:     General: No focal deficit present.     Mental Status: She is alert and oriented to person, place, and time. Mental status is at baseline.  Psychiatric:        Mood and Affect: Mood normal.        Behavior: Behavior normal.        Thought Content: Thought content normal.        Judgment: Judgment normal.     BP 104/72   Pulse 77   Temp (!) 97.4 F (36.3 C) (Temporal)   Resp 18   Ht 5' 4.25" (1.632 m)   Wt 211 lb 3.2 oz (95.8 kg)   LMP 05/30/2020   SpO2 94%   BMI 35.97 kg/m  Wt Readings from Last 3 Encounters:  07/02/20 211 lb 3.2 oz (95.8 kg)  06/17/20 209 lb 6.4 oz (95 kg)  06/26/19 212 lb 3.2 oz (96.3 kg)     Health Maintenance Due  Topic Date Due  . INFLUENZA VACCINE  06/30/2020    There are no preventive care reminders to display for this patient.  Lab Results  Component Value Date   TSH 0.254 (L) 06/17/2020   Lab Results  Component Value Date   WBC 7.3 06/17/2020   HGB 15.5 06/17/2020   HCT 47.5 (H) 06/17/2020   MCV 85 06/17/2020   PLT 334 06/17/2020   Lab Results  Component Value Date   NA 136 10/02/2015   K 4.0 10/02/2015   CO2 26 10/02/2015   GLUCOSE 87 10/02/2015   BUN 7 10/02/2015   CREATININE 0.76 10/02/2015   BILITOT 0.3 10/02/2015   ALKPHOS 50 10/02/2015   AST 16 10/02/2015   ALT 18 10/02/2015   PROT 7.2 10/02/2015   ALBUMIN 4.0 10/02/2015   CALCIUM 9.2 10/02/2015   Lab Results  Component Value Date   CHOL 201 (H) 06/26/2019   Lab Results  Component Value Date   HDL 37 (L) 06/26/2019   Lab Results  Component Value Date   LDLCALC 137 (H) 06/26/2019   Lab Results  Component Value Date   TRIG 133 06/26/2019   Lab Results  Component Value Date   CHOLHDL 5.4 (H) 06/26/2019   Lab Results  Component Value Date   HGBA1C 5.6 10/02/2015      Assessment & Plan:   Problem List Items Addressed This Visit    None    Visit  Diagnoses    Visit for  TB skin test    -  Primary   Encounter for pre-employment health screening examination          No orders of the defined types were placed in this encounter.   Follow-up: No follow-ups on file.   PLAN  No concerns on history or exam  Paperwork filled out and placed in provider inbox  Pt to return for skin test read on Thursday  Patient encouraged to call clinic with any questions, comments, or concerns.  Janeece Agee, NP

## 2020-07-02 NOTE — Progress Notes (Signed)

## 2020-07-02 NOTE — Telephone Encounter (Signed)
Patient came in for a Tb skin test. Test was administered in left forearm area , and was advised to return in 48 hours from the time test was given to have the results read.

## 2020-07-04 ENCOUNTER — Ambulatory Visit (INDEPENDENT_AMBULATORY_CARE_PROVIDER_SITE_OTHER): Payer: BC Managed Care – PPO | Admitting: Family Medicine

## 2020-07-04 ENCOUNTER — Other Ambulatory Visit: Payer: Self-pay

## 2020-07-04 DIAGNOSIS — Z111 Encounter for screening for respiratory tuberculosis: Secondary | ICD-10-CM

## 2020-07-04 LAB — READ PPD

## 2020-07-04 NOTE — Progress Notes (Signed)
Patient returned today for her TB reading - results: negative . Patient requested  form to be completed with results and faxed in if possible.

## 2020-07-05 ENCOUNTER — Telehealth: Payer: Self-pay | Admitting: Registered Nurse

## 2020-07-05 NOTE — Telephone Encounter (Signed)
Spoke with patient to let her know I will fax the paper right now.

## 2020-07-05 NOTE — Telephone Encounter (Signed)
Pt was is calling needs wellness certificate completed for her new job. She was looking to pick up today

## 2020-07-12 ENCOUNTER — Ambulatory Visit (HOSPITAL_COMMUNITY)
Admission: RE | Admit: 2020-07-12 | Discharge: 2020-07-12 | Disposition: A | Discharge status: Home / Self Care | Payer: BC Managed Care – PPO | Source: Ambulatory Visit | Attending: Family Medicine | Admitting: Family Medicine

## 2020-07-12 ENCOUNTER — Other Ambulatory Visit: Payer: Self-pay

## 2020-07-12 ENCOUNTER — Ambulatory Visit (INDEPENDENT_AMBULATORY_CARE_PROVIDER_SITE_OTHER): Payer: BC Managed Care – PPO

## 2020-07-12 ENCOUNTER — Encounter: Payer: Self-pay | Admitting: Family Medicine

## 2020-07-12 ENCOUNTER — Ambulatory Visit: Payer: BC Managed Care – PPO | Admitting: Family Medicine

## 2020-07-12 VITALS — BP 138/80 | HR 119 | Temp 97.6°F | Ht 64.0 in | Wt 208.2 lb

## 2020-07-12 DIAGNOSIS — M79661 Pain in right lower leg: Secondary | ICD-10-CM

## 2020-07-12 DIAGNOSIS — R600 Localized edema: Secondary | ICD-10-CM

## 2020-07-12 DIAGNOSIS — M79671 Pain in right foot: Secondary | ICD-10-CM | POA: Diagnosis not present

## 2020-07-12 DIAGNOSIS — M7989 Other specified soft tissue disorders: Secondary | ICD-10-CM | POA: Diagnosis not present

## 2020-07-12 MED ORDER — RIVAROXABAN 15 MG PO TABS: 15.0000 mg | ORAL_TABLET | Freq: Two times a day (BID) | ORAL | 0 refills | Status: DC

## 2020-07-12 NOTE — Patient Instructions (Addendum)
Go straight to the hospital to get the ultrasound done on the calf.  You are to go by registration and onto the ultrasound department.  If it is positive, I had written on the sheet that you need to go to the emergency room.  However I have changed that and I am giving you a prescription for a blood thinner, but you are not to get it filled until you are told to.  If you do have a clot, which I suspect, you should go home, keep your foot elevated, minimal ambulation this weekend.  I am giving you a prescription for Xarelto 15 mg, take 1 daily starting tonight.  If you have a blood clot this will need to be continued for 3 weeks and then switched over to a once daily dosing of 20 mg.  Return to see your primary care Janeece Agee, NP about Tuesday.  He will need to reorder the Xarelto so you get the proper course of treatment.  In the event of chest pain, shortness of breath, lightheadedness, palpitations, or other problems go to the emergency room.  Call 911 if necessary.  Stop taking your birth control pills

## 2020-07-12 NOTE — Progress Notes (Signed)
Lower extremity venous has been completed.   Preliminary results in CV Proc.   Blanch Media 07/12/2020 4:20 PM

## 2020-07-12 NOTE — Progress Notes (Signed)
Patient ID: Krystal Gallagher, female    DOB: Jan 30, 1986  Age: 34 y.o. MRN: [id number]  Chief Complaint  Patient presents with  . calf pain    Pt stated that she has been experiencing some really bad Rt calf pain that started on 07/05/2020. It got better but by 07/07/2020 it had gotten worse and the pain is still ongoinging. The pain start in the calf and moves dw her leg which is now swollen.    Subjective:   For 1 week the patient has had some pain in her right calf.  Now she is getting more swelling and increasing pain so she came on in today.  She is on birth control pills does not smoke.  No family history of DVT or clotting disorders.  She has not had any chest pains or palpitations or shortness of breath  Current allergies, medications, problem list, past/family and social histories reviewed.  Objective:  BP 138/80 (BP Location: Right Arm, Patient Position: Sitting, Cuff Size: Large)   Pulse (!) 119   Temp 97.6 F (36.4 C) (Temporal)   Ht 5\' 4"  (1.626 m)   Wt 208 lb 3.2 oz (94.4 kg)   LMP 07/09/2020   SpO2 96%   BMI 35.74 kg/m   Chest clear.  Heart regular without murmur.  Essentially no tenderness of the thigh.  She is swollen from the knee down to the foot.  Very tender to touch over the medial calf on the right.  Strongly positive Homans' sign.  Patient was instructed that this is a DVT until proven otherwise.  Assessment & Plan:   Assessment: 1. Right calf pain   2. Leg edema, right       Plan: Stat order for venous ultrasound.  Orders Placed This Encounter  Procedures  . DG Tibia/Fibula Right    Order Specific Question:   Reason for Exam (SYMPTOM  OR DIAGNOSIS REQUIRED)    Answer:   pain and swelling in Rt lower leg    Order Specific Question:   Is patient pregnant?    Answer:   No    Order Specific Question:   Preferred imaging location?    Answer:   External    Order Specific Question:   Radiology Contrast Protocol - do NOT remove file path    Answer:    \\[email or web address]\DXFluoroContrastProtocols.pdf  . DG Foot Complete Right    Order Specific Question:   Reason for Exam (SYMPTOM  OR DIAGNOSIS REQUIRED)    Answer:   pain and swelling in Rt foot    Order Specific Question:   Is patient pregnant?    Answer:   No    Order Specific Question:   Preferred imaging location?    Answer:   External    Order Specific Question:   Radiology Contrast Protocol - do NOT remove file path    Answer:   \\[email or web address]\DXFluoroContrastProtocols.pdf    Meds ordered this encounter  Medications  . Rivaroxaban (XARELTO) 15 MG TABS tablet    Sig: Take 1 tablet (15 mg total) by mouth 2 (two) times daily with a meal.    Dispense:  42 tablet    Refill:  0         Patient Instructions  Go straight to the hospital to get the ultrasound done on the calf.  You are to go by registration and onto the ultrasound department.  If it is positive, I had written on the sheet that you need to go to the emergency room.  However I have changed that and I am giving you a prescription for a blood thinner, but you are not to get it filled until you are told to.  If you do have a clot, which I suspect, you should go home, keep your foot elevated, minimal ambulation this weekend.  I am giving you a prescription for Xarelto 15 mg, take 1 daily starting tonight.  If you have a blood clot this will need to be continued for 3 weeks and then switched over to a once daily dosing of 20 mg.  Return to see your primary care Janeece Agee, NP about Tuesday.  He will need to reorder the Xarelto so you get the proper course of treatment.  In the event of chest pain, shortness of breath, lightheadedness, palpitations, or other problems go to the emergency room.  Call 911 if necessary.  Stop taking your birth control pills    Return in about 4 days (around 07/16/2020), or Janeece Agee, NP, for Clot follow-up.  Addendum: Test is clearly positive for DVT.  The  technician was instructed patient to go ahead and get the medicine.  There was no appointment for Mr. Kateri Plummer, but they did give her an appointment with someone else about Tuesday of next week.  I gave her a work excuse through next week, but that can be decided on after she is rechecked. Janace Hoard, MD 07/12/2020

## 2020-07-16 ENCOUNTER — Encounter: Payer: Self-pay | Admitting: Emergency Medicine

## 2020-07-16 ENCOUNTER — Other Ambulatory Visit: Payer: Self-pay

## 2020-07-16 ENCOUNTER — Ambulatory Visit: Payer: BC Managed Care – PPO | Admitting: Emergency Medicine

## 2020-07-16 VITALS — BP 112/79 | HR 98 | Temp 98.1°F | Resp 16 | Ht 64.0 in | Wt 207.0 lb

## 2020-07-16 DIAGNOSIS — I82431 Acute embolism and thrombosis of right popliteal vein: Secondary | ICD-10-CM | POA: Diagnosis not present

## 2020-07-16 NOTE — Assessment & Plan Note (Addendum)
Provoked DVT secondary to birth control pills.  On Xarelto 15 mg twice a day for 21 days and then 20 mg to complete 53-month treatment.  Doing well.  No complications. Follow-up with Janeece Agee in 2 to 3 weeks or earlier as needed.

## 2020-07-16 NOTE — Progress Notes (Addendum)
Krystal Gallagher 34 y.o.   Chief Complaint  Patient presents with  . calf pain    RIGHT-follow up because of a blood clot    HISTORY OF PRESENT ILLNESS: This is a 34 y.o. female presented for follow-up of DVT popliteal vein diagnosed on 07/12/2020.  Believed to be secondary to birth control pills.  Started on Xarelto.  Presently taking 15 mg twice a day.  Denies difficulty breathing or chest pain.  No bleeding problems. No other complaints or medical concerns today.  Non-smoker.  No chronic medical problems.  HPI   Prior to Admission medications   Medication Sig Start Date End Date Taking? Authorizing Provider  fexofenadine (ALLEGRA) 180 MG tablet Take 180 mg by mouth daily.    [provider]  levothyroxine (SYNTHROID) 50 MCG tablet Take 1 tablet (50 mcg total) by mouth daily before breakfast. 06/18/20   Janeece Agee, NP  norethindrone-ethinyl estradiol (NORTREL 7/7/7) 0.5/0.75/1-35 MG-MCG tablet Take 1 tablet by mouth daily. 06/17/20   Janeece Agee, NP  OVER THE COUNTER MEDICATION daily.    [provider]  Rivaroxaban (XARELTO) 15 MG TABS tablet Take 1 tablet (15 mg total) by mouth 2 (two) times daily with a meal. 07/12/20   Peyton Najjar, MD  VITAMIN D, CHOLECALCIFEROL, PO Take by mouth daily.    [provider]    No Known Allergies  Patient Active Problem List   Diagnosis Date Noted  . Menstrual problem 05/04/2019  . Vaginal itching 05/04/2019  . Uses oral contraception 04/04/2013  . Abnormal Pap smear, low grade squamous intraepithelial lesion (LGSIL) 04/04/2013  . Obesity (BMI 30.0-34.9) 03/05/2013  . HYPOTHYROIDISM 04/11/2012    No past medical history on file.  No past surgical history on file.  Social History   Socioeconomic History  . Marital status: Single    Spouse name: Not on file  . Number of children: 0  . Years of education: Not on file  . Highest education level: Not on file  Occupational History  . Occupation: Administrator, arts: [employer]  Tobacco Use  . Smoking status: Never Smoker  . Smokeless tobacco: Never Used  Vaping Use  . Vaping Use: Never used  Substance and Sexual Activity  . Alcohol use: No  . Drug use: No  . Sexual activity: Yes    Comment: number of sex partners in the last 12 months 1  Other Topics Concern  . Not on file  Social History Narrative   Exercise jogging 3 times per week for 1 hour. Single. Education: [employer].   Social Determinants of Health   Financial Resource Strain:   . Difficulty of Paying Living Expenses:   Food Insecurity:   . Worried About Programme researcher, broadcasting/film/video in the Last Year:   . Barista in the Last Year:   Transportation Needs:   . Freight forwarder (Medical):   Marland Kitchen Lack of Transportation (Non-Medical):   Physical Activity:   . Days of Exercise per Week:   . Minutes of Exercise per Session:   Stress:   . Feeling of Stress :   Social Connections:   . Frequency of Communication with Friends and Family:   . Frequency of Social Gatherings with Friends and Family:   . Attends Religious Services:   . Active Member of Clubs or Organizations:   . Attends Banker Meetings:   Marland Kitchen Marital Status:   Intimate Partner Violence:   . Fear of  Current or Ex-Partner:   . Emotionally Abused:   Marland Kitchen Physically Abused:   . Sexually Abused:     Family History  Problem Relation Age of Onset  . Diabetes Paternal Grandfather   . Diabetes Maternal Grandfather   . Hyperlipidemia Mother      Review of Systems  Constitutional: Negative.  Negative for chills and fever.  HENT: Negative.  Negative for congestion, nosebleeds and sore throat.   Respiratory: Negative.  Negative for cough, hemoptysis and shortness of breath.   Cardiovascular: Negative.  Negative for chest pain and palpitations.  Gastrointestinal: Negative.  Negative for abdominal pain, blood in stool, diarrhea, nausea and vomiting.  Genitourinary: Negative.  Negative for  dysuria and hematuria.  Skin: Negative.  Negative for rash.  Neurological: Negative.  Negative for dizziness and headaches.  Endo/Heme/Allergies: Does not bruise/bleed easily.  All other systems reviewed and are negative.  Today's Vitals   07/16/20 1453  BP: 112/79  Pulse: 98  Resp: 16  Temp: 98.1 F (36.7 C)  TempSrc: Temporal  SpO2: 98%  Weight: 207 lb (93.9 kg)  Height:  (1.626 m)   Body mass index is 35.53 kg/m.   Physical Exam Vitals reviewed.  Constitutional:      Appearance: Normal appearance.  HENT:     Head: Normocephalic.  Eyes:     Extraocular Movements: Extraocular movements intact.  Cardiovascular:     Rate and Rhythm: Normal rate.  Pulmonary:     Effort: Pulmonary effort is normal.  Musculoskeletal:     Cervical back: Normal range of motion.     Comments: Right lower extremity: Edema of calf and ankle.  Findings compatible with DVT.  Skin:    General: Skin is warm and dry.     Capillary Refill: Capillary refill takes less than 2 seconds.  Neurological:     General: No focal deficit present.     Mental Status: She is alert and oriented to person, place, and time.  Psychiatric:        Mood and Affect: Mood normal.        Behavior: Behavior normal.    A total of 30 minutes was spent with the patient, greater than 50% of which was in counseling/coordination of care regarding diagnosis of DVT, treatment, anticoagulation medication side effects, fall prevention, review of ultrasound report, review of medication on the proper way of using Xarelto, need to stop birth control pill, avoid getting pregnant, diet and nutrition, prognosis and need for follow-up.   ASSESSMENT & PLAN: Acute deep vein thrombosis (DVT) of popliteal vein of right lower extremity (HCC) Provoked DVT secondary to birth control pills.  On Xarelto 15 mg twice a day for 21 days and then 20 mg to complete 68-month treatment.  Doing well.  No complications. Follow-up with Janeece Agee  in 2 to 3 weeks or earlier as needed.   Derica was seen today for calf pain.  Diagnoses and all orders for this visit:  Acute deep vein thrombosis (DVT) of popliteal vein of right lower extremity (HCC)    Patient Instructions   Continue Xarelto 15 mg twice a day to complete 21 days.  After that take 20 mg daily to complete 3 months therapy. Follow-up in 2 weeks.    If you have lab work done today you will be contacted with your lab results within the next 2 weeks.  If you have not heard from [place] then please contact us. The fastest way to get your results is  to register for My Chart.   IF you received an x-ray today, you will receive an invoice from [facility]. Please contact [facility] at [phone] with questions or concerns regarding your invoice.   IF you received labwork today, you will receive an invoice from [facility]. Please contact [facility] at [phone] with questions or concerns regarding your invoice.   Our billing staff will not be able to assist you with questions regarding bills from these companies.  You will be contacted with the lab results as soon as they are available. The fastest way to get your results is to activate your My Chart account. Instructions are located on the last page of this paperwork. If you have not heard from [place] regarding the results in 2 weeks, please contact this office.     Deep Vein Thrombosis  Deep vein thrombosis (DVT) is a condition in which a blood clot forms in a deep vein, such as a lower leg, thigh, or arm vein. A clot is blood that has thickened into a gel or solid. This condition is dangerous. It can lead to serious and even life-threatening complications if the clot travels to the lungs and causes a blockage (pulmonary embolism). It can also damage veins in the leg. This can result in leg pain, swelling, discoloration, and sores (post-thrombotic syndrome). What are the causes? This condition may be caused  by:  A slowdown of blood flow.  Damage to a vein.  A condition that causes blood to clot more easily, such as an inherited clotting disorder. What increases the risk? The following factors may make you more likely to develop this condition:  Being overweight.  Being older, especially over age 49.  Sitting or lying down for more than four hours.  Being in the hospital.  Lack of physical activity (sedentary lifestyle).  Pregnancy, being in childbirth, or having recently given birth.  Taking medicines that contain estrogen, such as medicines to prevent pregnancy.  Smoking.  A history of any of the following: ? Blood clots or a blood clotting disease. ? Peripheral vascular disease. ? Inflammatory bowel disease. ? Cancer. ? Heart disease. ? Genetic conditions that affect how your blood clots, such as Factor V Leiden mutation. ? Neurological diseases that affect your legs (leg paresis). ? A recent injury, such as a car accident. ? Major or lengthy surgery. ? A central line placed inside a large vein. What are the signs or symptoms? Symptoms of this condition include:  Swelling, pain, or tenderness in an arm or leg.  Warmth, redness, or discoloration in an arm or leg. If the clot is in your leg, symptoms may be more noticeable or worse when you stand or walk. Some people may not develop any symptoms. How is this diagnosed? This condition is diagnosed with:  A medical history and physical exam.  Tests, such as: ? Blood tests. These are done to check how well your blood clots. ? Ultrasound. This is done to check for clots. ? Venogram. For this test, contrast dye is injected into a vein and X-rays are taken to check for any clots. How is this treated? Treatment for this condition depends on:  The cause of your DVT.  Your risk for bleeding or developing more clots.  Any other medical conditions that you have. Treatment may include:  Taking a blood thinner  (anticoagulant). This type of medicine prevents clots from forming. It may be taken by mouth, injected under the skin, or injected through an IV (catheter).  Injecting clot-dissolving medicines into the affected vein (catheter-directed thrombolysis).  Having surgery. Surgery may be done to: ? Remove the clot. ? Place a filter in a large vein to catch blood clots before they reach the lungs. Some treatments may be continued for up to six months. Follow these instructions at home: If you are taking blood thinners:  Take the medicine exactly as told by your health care provider. Some blood thinners need to be taken at the same time every day. Do not skip a dose.  Talk with your health care provider before you take any medicines that contain aspirin or NSAIDs. These medicines increase your risk for dangerous bleeding.  Ask your health care provider about foods and drugs that could change the way the medicine works (may interact). Avoid those things if your health care provider tells you to do so.  Blood thinners can cause easy bruising and may make it difficult to stop bleeding. Because of this: ? Be very careful when using knives, scissors, or other sharp objects. ? Use an electric razor instead of a blade. ? Avoid activities that could cause injury or bruising, and follow instructions about how to prevent falls.  Wear a medical alert bracelet or carry a card that lists what medicines you take. General instructions  Take over-the-counter and prescription medicines only as told by your health care provider.  Return to your normal activities as told by your health care provider. Ask your health care provider what activities are safe for you.  Wear compression stockings if recommended by your health care provider.  Keep all follow-up visits as told by your health care provider. This is important. How is this prevented? To lower your risk of developing this condition again:  For 30 or  more minutes every day, do an activity that: ? Involves moving your arms and legs. ? Increases your heart rate.  When traveling for longer than four hours: ? Exercise your arms and legs every hour. ? Drink plenty of water. ? Avoid drinking alcohol.  Avoid sitting or lying for a long time without moving your legs.  If you have surgery or you are hospitalized, ask about ways to prevent blood clots. These may include taking frequent walks or using anticoagulants.  Stay at a healthy weight.  If you are a woman who is older than age 34, avoid unnecessary use of medicines that contain estrogen, such as some birth control pills.  Do not use any products that contain nicotine or tobacco, such as cigarettes and e-cigarettes. This is especially important if you take estrogen medicines. If you need help quitting, ask your health care provider. Contact a health care provider if:  You miss a dose of your blood thinner.  Your menstrual period is heavier than usual.  You have unusual bruising. Get help right away if:  You have: ? New or increased pain, swelling, or redness in an arm or leg. ? Numbness or tingling in an arm or leg. ? Shortness of breath. ? Chest pain. ? A rapid or irregular heartbeat. ? A severe headache or confusion. ? A cut that will not stop bleeding.  There is blood in your vomit, stool, or urine.  You have a serious fall or accident, or you hit your head.  You feel light-headed or dizzy.  You cough up blood. These symptoms may represent a serious problem that is an emergency. Do not wait to see if the symptoms will go away. Get medical help right away. Call  your local emergency services (911 in the [place] Do not drive yourself to the hospital. Summary  Deep vein thrombosis (DVT) is a condition in which a blood clot forms in a deep vein, such as a lower leg, thigh, or arm vein.  Symptoms can include swelling, warmth, pain, and redness in your leg or arm.  This  condition may be treated with a blood thinner (anticoagulant medicine), medicine that is injected to dissolve blood clots,compression stockings, or surgery.  If you are prescribed blood thinners, take them exactly as told. This information is not intended to replace advice given to you by your health care provider. Make sure you discuss any questions you have with your health care provider. Document Revised: 10/29/2017 Document Reviewed: 04/16/2017 Elsevier Patient Education  2020 [employer]      Edwina Barth, MD [facility]

## 2020-07-16 NOTE — Patient Instructions (Addendum)
Continue Xarelto 15 mg twice a day to complete 21 days.  After that take 20 mg daily to complete 3 months therapy. Follow-up in 2 weeks.    If you have lab work done today you will be contacted with your lab results within the next 2 weeks.  If you have not heard from [place] then please contact us. The fastest way to get your results is to register for My Chart.   IF you received an x-ray today, you will receive an invoice from [facility]. Please contact [facility] at [phone] with questions or concerns regarding your invoice.   IF you received labwork today, you will receive an invoice from [place]. Please contact [facility] at [phone] with questions or concerns regarding your invoice.   Our billing staff will not be able to assist you with questions regarding bills from these companies.  You will be contacted with the lab results as soon as they are available. The fastest way to get your results is to activate your My Chart account. Instructions are located on the last page of this paperwork. If you have not heard from [place] regarding the results in 2 weeks, please contact this office.     Deep Vein Thrombosis  Deep vein thrombosis (DVT) is a condition in which a blood clot forms in a deep vein, such as a lower leg, thigh, or arm vein. A clot is blood that has thickened into a gel or solid. This condition is dangerous. It can lead to serious and even life-threatening complications if the clot travels to the lungs and causes a blockage (pulmonary embolism). It can also damage veins in the leg. This can result in leg pain, swelling, discoloration, and sores (post-thrombotic syndrome). What are the causes? This condition may be caused by:  A slowdown of blood flow.  Damage to a vein.  A condition that causes blood to clot more easily, such as an inherited clotting disorder. What increases the risk? The following factors may make you more likely to develop this  condition:  Being overweight.  Being older, especially over age 35.  Sitting or lying down for more than four hours.  Being in the hospital.  Lack of physical activity (sedentary lifestyle).  Pregnancy, being in childbirth, or having recently given birth.  Taking medicines that contain estrogen, such as medicines to prevent pregnancy.  Smoking.  A history of any of the following: ? Blood clots or a blood clotting disease. ? Peripheral vascular disease. ? Inflammatory bowel disease. ? Cancer. ? Heart disease. ? Genetic conditions that affect how your blood clots, such as Factor V Leiden mutation. ? Neurological diseases that affect your legs (leg paresis). ? A recent injury, such as a car accident. ? Major or lengthy surgery. ? A central line placed inside a large vein. What are the signs or symptoms? Symptoms of this condition include:  Swelling, pain, or tenderness in an arm or leg.  Warmth, redness, or discoloration in an arm or leg. If the clot is in your leg, symptoms may be more noticeable or worse when you stand or walk. Some people may not develop any symptoms. How is this diagnosed? This condition is diagnosed with:  A medical history and physical exam.  Tests, such as: ? Blood tests. These are done to check how well your blood clots. ? Ultrasound. This is done to check for clots. ? Venogram. For this test, contrast dye is injected into a vein and X-rays are taken to check  for any clots. How is this treated? Treatment for this condition depends on:  The cause of your DVT.  Your risk for bleeding or developing more clots.  Any other medical conditions that you have. Treatment may include:  Taking a blood thinner (anticoagulant). This type of medicine prevents clots from forming. It may be taken by mouth, injected under the skin, or injected through an IV (catheter).  Injecting clot-dissolving medicines into the affected vein (catheter-directed  thrombolysis).  Having surgery. Surgery may be done to: ? Remove the clot. ? Place a filter in a large vein to catch blood clots before they reach the lungs. Some treatments may be continued for up to six months. Follow these instructions at home: If you are taking blood thinners:  Take the medicine exactly as told by your health care provider. Some blood thinners need to be taken at the same time every day. Do not skip a dose.  Talk with your health care provider before you take any medicines that contain aspirin or NSAIDs. These medicines increase your risk for dangerous bleeding.  Ask your health care provider about foods and drugs that could change the way the medicine works (may interact). Avoid those things if your health care provider tells you to do so.  Blood thinners can cause easy bruising and may make it difficult to stop bleeding. Because of this: ? Be very careful when using knives, scissors, or other sharp objects. ? Use an electric razor instead of a blade. ? Avoid activities that could cause injury or bruising, and follow instructions about how to prevent falls.  Wear a medical alert bracelet or carry a card that lists what medicines you take. General instructions  Take over-the-counter and prescription medicines only as told by your health care provider.  Return to your normal activities as told by your health care provider. Ask your health care provider what activities are safe for you.  Wear compression stockings if recommended by your health care provider.  Keep all follow-up visits as told by your health care provider. This is important. How is this prevented? To lower your risk of developing this condition again:  For 30 or more minutes every day, do an activity that: ? Involves moving your arms and legs. ? Increases your heart rate.  When traveling for longer than four hours: ? Exercise your arms and legs every hour. ? Drink plenty of water. ? Avoid  drinking alcohol.  Avoid sitting or lying for a long time without moving your legs.  If you have surgery or you are hospitalized, ask about ways to prevent blood clots. These may include taking frequent walks or using anticoagulants.  Stay at a healthy weight.  If you are a woman who is older than age 10, avoid unnecessary use of medicines that contain estrogen, such as some birth control pills.  Do not use any products that contain nicotine or tobacco, such as cigarettes and e-cigarettes. This is especially important if you take estrogen medicines. If you need help quitting, ask your health care provider. Contact a health care provider if:  You miss a dose of your blood thinner.  Your menstrual period is heavier than usual.  You have unusual bruising. Get help right away if:  You have: ? New or increased pain, swelling, or redness in an arm or leg. ? Numbness or tingling in an arm or leg. ? Shortness of breath. ? Chest pain. ? A rapid or irregular heartbeat. ? A severe headache or  confusion. ? A cut that will not stop bleeding.  There is blood in your vomit, stool, or urine.  You have a serious fall or accident, or you hit your head.  You feel light-headed or dizzy.  You cough up blood. These symptoms may represent a serious problem that is an emergency. Do not wait to see if the symptoms will go away. Get medical help right away. Call your local emergency services (911 in the [place] Do not drive yourself to the hospital. Summary  Deep vein thrombosis (DVT) is a condition in which a blood clot forms in a deep vein, such as a lower leg, thigh, or arm vein.  Symptoms can include swelling, warmth, pain, and redness in your leg or arm.  This condition may be treated with a blood thinner (anticoagulant medicine), medicine that is injected to dissolve blood clots,compression stockings, or surgery.  If you are prescribed blood thinners, take them exactly as told. This  information is not intended to replace advice given to you by your health care provider. Make sure you discuss any questions you have with your health care provider. Document Revised: 10/29/2017 Document Reviewed: 04/16/2017 Elsevier Patient Education  2020 [employer].

## 2020-07-29 ENCOUNTER — Ambulatory Visit: Payer: BC Managed Care – PPO | Admitting: Registered Nurse

## 2020-07-29 ENCOUNTER — Other Ambulatory Visit: Payer: Self-pay

## 2020-07-29 ENCOUNTER — Encounter: Payer: Self-pay | Admitting: Registered Nurse

## 2020-07-29 VITALS — BP 126/80 | HR 105 | Temp 97.6°F | Ht 64.0 in | Wt 207.6 lb

## 2020-07-29 DIAGNOSIS — I82431 Acute embolism and thrombosis of right popliteal vein: Secondary | ICD-10-CM

## 2020-07-29 MED ORDER — RIVAROXABAN 20 MG PO TABS: 20.0000 mg | ORAL_TABLET | Freq: Every day | ORAL | 0 refills | Status: DC

## 2020-07-29 NOTE — Patient Instructions (Signed)
° ° ° °  If you have lab work done today you will be contacted with your lab results within the next 2 weeks.  If you have not heard from us then please contact us. The fastest way to get your results is to register for My Chart. ° ° °IF you received an x-ray today, you will receive an invoice from [facility]. Please contact [facility] at [phone] with questions or concerns regarding your invoice.  ° °IF you received labwork today, you will receive an invoice from [facility]. Please contact [facility] at [phone] with questions or concerns regarding your invoice.  ° °Our billing staff will not be able to assist you with questions regarding bills from these companies. ° °You will be contacted with the lab results as soon as they are available. The fastest way to get your results is to activate your My Chart account. Instructions are located on the last page of this paperwork. If you have not heard from us regarding the results in 2 weeks, please contact this office. °  ° ° ° °

## 2020-07-29 NOTE — Progress Notes (Signed)
Established Patient Office Visit  Subjective:  Patient ID: Krystal Gallagher, female    DOB: 1986-07-03  Age: 34 y.o. MRN: [id number]  CC:  Chief Complaint  Patient presents with   DVT of right leg    f/u     HPI Krystal Gallagher presents for dvt f/u   Onset of pain on 07/05/20. Was seen on 07/08/20 by Dr. Alwyn Ren. Sent for doppler which confirmed R popliteal dvt. Was started on rivoroxaban 15mg  PO bid for three weeks with planned decrease to 20mg  PO qd for the remainder of a 3 month period.  Has been compliant with treatments and precautions Noted that swelling has decreased dramatically, pain has resolved.  No signs of complications: no cough, pink sputum, fatigue, shob, headaches, visual changes, swelling, chest pain  No other concerns at this time.  No past medical history on file.  No past surgical history on file.  Family History  Problem Relation Age of Onset   Diabetes Paternal Grandfather    Diabetes Maternal Grandfather    Hyperlipidemia Mother     Social History   Socioeconomic History   Marital status: Single    Spouse name: Not on file   Number of children: 0   Years of education: Not on file   Highest education level: Not on file  Occupational History   Occupation: : [employer]  Tobacco Use   Smoking status: Never Smoker   Smokeless tobacco: Never Used  Use: Never used  Substance and Sexual Activity   Alcohol use: No   Drug use: No   Sexual activity: Yes    Comment: number of sex partners in the last 12 months 1  Other Topics Concern   Not on file  Social History Narrative   Exercise jogging 3 times per week for 1 hour. Single. Education: Magazine features editor.   Social Determinants of Health   Financial Resource Strain:    Difficulty of Paying Living Expenses: Not on file  Food Insecurity:    Worried About Building services engineer in the Last Year: Not on file   [employer] of Food in the Last Year: Not on  file  Transportation Needs:    Lack of Transportation (Medical): Not on file   Lack of Transportation (Non-Medical): Not on file  Physical Activity:    Days of Exercise per Week: Not on file   Minutes of Exercise per Session: Not on file  Stress:    Feeling of Stress : Not on file  Social Connections:    Frequency of Communication with Friends and Family: Not on file   Frequency of Social Gatherings with Friends and Family: Not on file   Attends Religious Services: Not on file   Active Member of Clubs or Organizations: Not on file   Attends Programme researcher, broadcasting/film/video Meetings: Not on file   Marital Status: Not on file  Intimate Partner Violence:    Fear of Current or Ex-Partner: Not on file   Emotionally Abused: Not on file   Physically Abused: Not on file   Sexually Abused: Not on file    Outpatient Medications Prior to Visit  Medication Sig Dispense Refill   fexofenadine (ALLEGRA) 180 MG tablet Take 180 mg by mouth daily.     levothyroxine (SYNTHROID) 50 MCG tablet Take 1 tablet (50 mcg total) by mouth daily before breakfast. 90 tablet 0   Rivaroxaban (XARELTO) 15 MG TABS tablet Take 1 tablet (15 mg  total) by mouth 2 (two) times daily with a meal. 42 tablet 0   VITAMIN D, CHOLECALCIFEROL, PO Take by mouth daily.     norethindrone-ethinyl estradiol (NORTREL 7/7/7) 0.5/0.75/1-35 MG-MCG tablet Take 1 tablet by mouth daily. (Patient not taking: Reported on 07/16/2020) 84 tablet 3   OVER THE COUNTER MEDICATION daily. (Patient not taking: Reported on 07/16/2020)     No facility-administered medications prior to visit.    No Known Allergies  ROS Review of Systems  Constitutional: Negative.   HENT: Negative.   Eyes: Negative.   Respiratory: Negative.   Cardiovascular: Positive for leg swelling. Negative for chest pain and palpitations.  Gastrointestinal: Negative.   Endocrine: Negative.   Genitourinary: Negative.   Musculoskeletal: Negative.   Skin: Negative.     Allergic/Immunologic: Negative.   Neurological: Negative.   Hematological: Negative.   Psychiatric/Behavioral: Negative.   All other systems reviewed and are negative.     Objective:    Physical Exam Vitals and nursing note reviewed.  Constitutional:      General: She is not in acute distress.    Appearance: Normal appearance. She is obese. She is not ill-appearing, toxic-appearing or diaphoretic.  Cardiovascular:     Rate and Rhythm: Normal rate and regular rhythm.  Neurological:     Mental Status: She is alert.     BP 126/80    Pulse (!) 105    Temp 97.6 F (36.4 C) (Temporal)    Ht 5\' 4"  (1.626 m)    Wt 207 lb 9.6 oz (94.2 kg)    LMP 07/09/2020    SpO2 97%    BMI 35.63 kg/m  Wt Readings from Last 3 Encounters:  07/29/20 207 lb 9.6 oz (94.2 kg)  07/16/20 207 lb (93.9 kg)  07/12/20 208 lb 3.2 oz (94.4 kg)     There are no preventive care reminders to display for this patient.  There are no preventive care reminders to display for this patient.  Lab Results  Component Value Date   TSH 0.254 (L) 06/17/2020   Lab Results  Component Value Date   WBC 7.3 06/17/2020   HGB 15.5 06/17/2020   HCT 47.5 (H) 06/17/2020   MCV 85 06/17/2020   PLT 334 06/17/2020   Lab Results  Component Value Date   NA 136 10/02/2015   K 4.0 10/02/2015   CO2 26 10/02/2015   GLUCOSE 87 10/02/2015   BUN 7 10/02/2015   CREATININE 0.76 10/02/2015   BILITOT 0.3 10/02/2015   ALKPHOS 50 10/02/2015   AST 16 10/02/2015   ALT 18 10/02/2015   PROT 7.2 10/02/2015   ALBUMIN 4.0 10/02/2015   CALCIUM 9.2 10/02/2015   Lab Results  Component Value Date   CHOL 201 (H) 06/26/2019   Lab Results  Component Value Date   HDL 37 (L) 06/26/2019   Lab Results  Component Value Date   LDLCALC 137 (H) 06/26/2019   Lab Results  Component Value Date   TRIG 133 06/26/2019   Lab Results  Component Value Date   CHOLHDL 5.4 (H) 06/26/2019   Lab Results  Component Value Date   HGBA1C 5.6  10/02/2015      Assessment & Plan:   Problem List Items Addressed This Visit      Cardiovascular and Mediastinum   Acute deep vein thrombosis (DVT) of popliteal vein of right lower extremity (HCC) - Primary   Relevant Medications   rivaroxaban (XARELTO) 20 MG TABS tablet      Meds  ordered this encounter  Medications   rivaroxaban (XARELTO) 20 MG TABS tablet    Sig: Take 1 tablet (20 mg total) by mouth daily with supper.    Dispense:  60 tablet    Refill:  0    Order Specific Question:   Supervising Provider    Answer:   Neva Seat, JEFFREY R [[id number]]    Follow-up: No follow-ups on file.   PLAN  Continue rivoroxaban  Follow up in 4-6 weeks  Reviewed red flags  Patient encouraged to call clinic with any questions, comments, or concerns.  Janeece Agee, NP

## 2020-08-26 ENCOUNTER — Encounter: Payer: Self-pay | Admitting: Registered Nurse

## 2020-08-26 NOTE — Telephone Encounter (Signed)
Pt requesting renewal of handicap parking marker. Last OV 07/29/2020 for DVT

## 2020-09-06 NOTE — Telephone Encounter (Signed)
Forms have been completed and placed in pt pick up. A copy has been placed in scan bin. Pt was notified by phone to come pick it up.

## 2020-09-06 NOTE — Telephone Encounter (Signed)
Form filled out in your box to be signed

## 2020-09-09 ENCOUNTER — Encounter: Payer: Self-pay | Admitting: Registered Nurse

## 2020-09-09 ENCOUNTER — Other Ambulatory Visit: Payer: Self-pay

## 2020-09-09 ENCOUNTER — Ambulatory Visit: Payer: BC Managed Care – PPO | Admitting: Registered Nurse

## 2020-09-09 VITALS — BP 118/83 | HR 76 | Temp 98.0°F | Resp 18 | Ht 64.0 in | Wt 213.8 lb

## 2020-09-09 DIAGNOSIS — I82431 Acute embolism and thrombosis of right popliteal vein: Secondary | ICD-10-CM

## 2020-09-09 NOTE — Patient Instructions (Signed)
° ° ° °  If you have lab work done today you will be contacted with your lab results within the next 2 weeks.  If you have not heard from us then please contact us. The fastest way to get your results is to register for My Chart. ° ° °IF you received an x-ray today, you will receive an invoice from [facility]. Please contact [facility] at [phone] with questions or concerns regarding your invoice.  ° °IF you received labwork today, you will receive an invoice from [facility]. Please contact [facility] at [phone] with questions or concerns regarding your invoice.  ° °Our billing staff will not be able to assist you with questions regarding bills from these companies. ° °You will be contacted with the lab results as soon as they are available. The fastest way to get your results is to activate your My Chart account. Instructions are located on the last page of this paperwork. If you have not heard from us regarding the results in 2 weeks, please contact this office. °  ° ° ° °

## 2020-09-17 ENCOUNTER — Other Ambulatory Visit: Payer: Self-pay | Admitting: Registered Nurse

## 2020-09-17 DIAGNOSIS — E039 Hypothyroidism, unspecified: Secondary | ICD-10-CM

## 2020-09-30 ENCOUNTER — Encounter: Payer: Self-pay | Admitting: Registered Nurse

## 2020-10-01 ENCOUNTER — Other Ambulatory Visit: Payer: Self-pay

## 2020-10-01 ENCOUNTER — Telehealth: Payer: Self-pay

## 2020-10-11 IMAGING — DX DG TIBIA/FIBULA 2V*R*
2 series · 2 of 2 positions shown · non-contrast
Comparison: None.

CLINICAL DATA: Pain and right lower extremity swelling.

EXAM:
RIGHT FOOT COMPLETE - 3+ VIEW; RIGHT TIBIA AND FIBULA - 2 VIEW

[tibia ap]
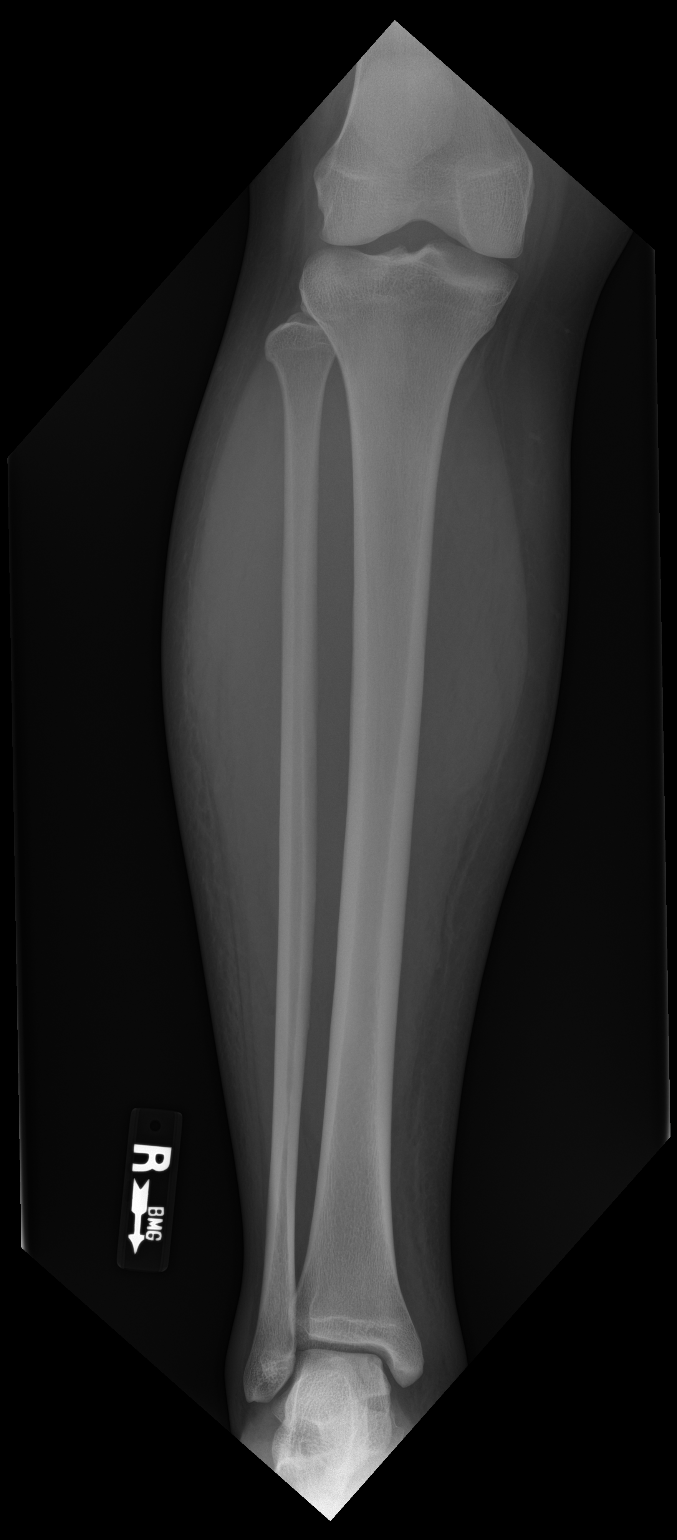

[tibia lat]
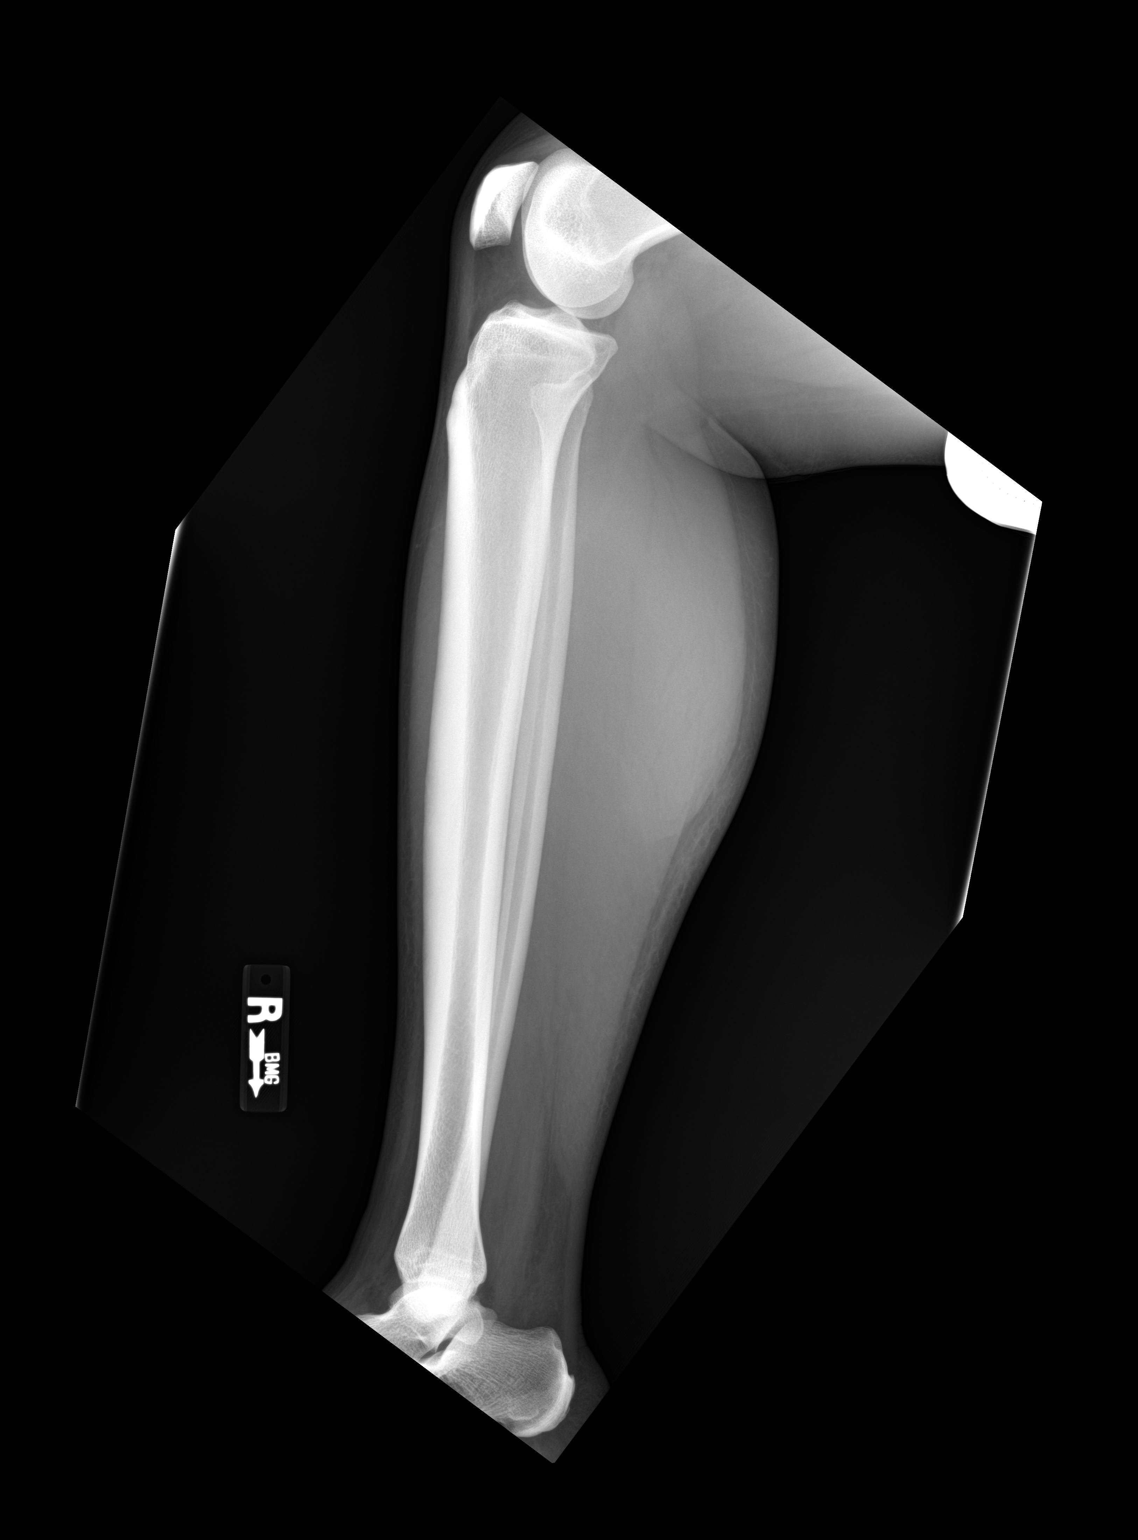

[2 of 2 positions shown; findings below may reference images not displayed]

FINDINGS: Right tibia/fibula: No fracture or focal bone lesion. Regional soft
tissues are unremarkable.

Right foot: There is no evidence of fracture or dislocation. There
is no evidence of arthropathy or other focal bone abnormality. Soft
tissues are unremarkable.
IMPRESSION: No acute osseous abnormality in the right tibia/fibula or right
foot.

## 2020-10-16 ENCOUNTER — Encounter: Payer: Self-pay | Admitting: Registered Nurse

## 2020-10-16 ENCOUNTER — Ambulatory Visit: Payer: BC Managed Care – PPO | Admitting: Registered Nurse

## 2020-10-16 ENCOUNTER — Other Ambulatory Visit: Payer: Self-pay

## 2020-10-16 VITALS — BP 118/82 | HR 85 | Temp 98.0°F | Resp 18 | Ht 64.0 in | Wt 213.2 lb

## 2020-10-16 DIAGNOSIS — M7061 Trochanteric bursitis, right hip: Secondary | ICD-10-CM | POA: Diagnosis not present

## 2020-10-16 MED ORDER — LIDOCAINE 5 % EX PTCH: 1.0000 | MEDICATED_PATCH | CUTANEOUS | 0 refills | Status: DC

## 2020-10-16 MED ORDER — TRAMADOL HCL 50 MG PO TABS: 50.0000 mg | ORAL_TABLET | Freq: Three times a day (TID) | ORAL | 0 refills | Status: AC | PRN

## 2020-10-16 NOTE — Patient Instructions (Signed)
° ° ° °  If you have lab work done today you will be contacted with your lab results within the next 2 weeks.  If you have not heard from us then please contact us. The fastest way to get your results is to register for My Chart. ° ° °IF you received an x-ray today, you will receive an invoice from [facility]. Please contact [facility] at [phone] with questions or concerns regarding your invoice.  ° °IF you received labwork today, you will receive an invoice from [facility]. Please contact [facility] at [phone] with questions or concerns regarding your invoice.  ° °Our billing staff will not be able to assist you with questions regarding bills from these companies. ° °You will be contacted with the lab results as soon as they are available. The fastest way to get your results is to activate your My Chart account. Instructions are located on the last page of this paperwork. If you have not heard from us regarding the results in 2 weeks, please contact this office. °  ° ° ° °

## 2020-10-27 NOTE — Progress Notes (Signed)
Established Patient Office Visit  Subjective:  Patient ID: Krystal Gallagher, female    DOB: [birth year]  Age: 34 y.o. MRN: [id number]  CC:  Chief Complaint  Patient presents with  . Follow-up    Patient is here for follow up for blood clot in right foot . Patient states the selling is going down and getting a little better    HPI Krystal Gallagher presents for dvt follow up  Improving but still having some discoloration and coolness in affected foot. Pain is much less. Tolerating medications okay. Able to move around on it more but still uses crutch most of time.  No new complaints or concerns.   No past medical history on file.  No past surgical history on file.  Family History  Problem Relation Age of Onset  . Diabetes Paternal Grandfather   . Diabetes Maternal Grandfather   . Hyperlipidemia Mother     Social History   Socioeconomic History  . Marital status: Single    Spouse name: Not on file  . Number of children: 0  . Years of education: Not on file  . Highest education level: Not on file  Occupational History  . Occupation: Magazine features editor: [employer]  Tobacco Use  . Smoking status: Never Smoker  . Smokeless tobacco: Never Used  Vaping Use  . Vaping Use: Never used  Substance and Sexual Activity  . Alcohol use: No  . Drug use: No  . Sexual activity: Yes    Comment: number of sex partners in the last 12 months 1  Other Topics Concern  . Not on file  Social History Narrative   Exercise jogging 3 times per week for 1 hour. Single. Education: [employer].   Social Determinants of Health   Financial Resource Strain:   . Difficulty of Paying Living Expenses: Not on file  Food Insecurity:   . Worried About Programme researcher, broadcasting/film/video in the Last Year: Not on file  . Ran Out of Food in the Last Year: Not on file  Transportation Needs:   . Lack of Transportation (Medical): Not on file  . Lack of Transportation (Non-Medical): Not on file  Physical Activity:   .  Days of Exercise per Week: Not on file  . Minutes of Exercise per Session: Not on file  Stress:   . Feeling of Stress : Not on file  Social Connections:   . Frequency of Communication with Friends and Family: Not on file  . Frequency of Social Gatherings with Friends and Family: Not on file  . Attends Religious Services: Not on file  . Active Member of Clubs or Organizations: Not on file  . Attends Banker Meetings: Not on file  . Marital Status: Not on file  Intimate Partner Violence:   . Fear of Current or Ex-Partner: Not on file  . Emotionally Abused: Not on file  . Physically Abused: Not on file  . Sexually Abused: Not on file    Outpatient Medications Prior to Visit  Medication Sig Dispense Refill  . fexofenadine (ALLEGRA) 180 MG tablet Take 180 mg by mouth daily.    . Rivaroxaban (XARELTO) 15 MG TABS tablet Take 1 tablet (15 mg total) by mouth 2 (two) times daily with a meal. 42 tablet 0  . rivaroxaban (XARELTO) 20 MG TABS tablet Take 1 tablet (20 mg total) by mouth daily with supper. 60 tablet 0  . VITAMIN D, CHOLECALCIFEROL, PO Take by mouth daily.    Marland Kitchen  levothyroxine (SYNTHROID) 50 MCG tablet Take 1 tablet (50 mcg total) by mouth daily before breakfast. 90 tablet 0   No facility-administered medications prior to visit.    No Known Allergies  ROS Review of Systems  Constitutional: Negative.   HENT: Negative.   Eyes: Negative.   Respiratory: Negative.   Cardiovascular: Negative.   Gastrointestinal: Negative.   Genitourinary: Negative.   Musculoskeletal: Negative.   Skin: Negative.   Neurological: Negative.   Psychiatric/Behavioral: Negative.       Objective:    Physical Exam Vitals and nursing note reviewed.  Constitutional:      General: She is not in acute distress.    Appearance: Normal appearance. She is normal weight. She is not ill-appearing, toxic-appearing or diaphoretic.  Cardiovascular:     Rate and Rhythm: Normal rate and regular  rhythm.     Heart sounds: Normal heart sounds. No murmur heard.  No friction rub. No gallop.   Pulmonary:     Effort: Pulmonary effort is normal. No respiratory distress.     Breath sounds: Normal breath sounds. No stridor. No wheezing, rhonchi or rales.  Chest:     Chest wall: No tenderness.  Musculoskeletal:        General: No tenderness. Normal range of motion.  Skin:    General: Skin is warm and dry.     Capillary Refill: Capillary refill takes 2 to 3 seconds.     Coloration: Skin is pale. Skin is not jaundiced.     Findings: No bruising, erythema, lesion or rash.  Neurological:     General: No focal deficit present.     Mental Status: She is alert and oriented to person, place, and time. Mental status is at baseline.  Psychiatric:        Mood and Affect: Mood normal.        Behavior: Behavior normal.        Thought Content: Thought content normal.        Judgment: Judgment normal.     BP 118/83   Pulse 76   Temp 98 F (36.7 C) (Temporal)   Resp 18   Ht 5\' 4"  (1.626 m)   Wt 213 lb 12.8 oz (97 kg)   SpO2 97%   BMI 36.70 kg/m  Wt Readings from Last 3 Encounters:  10/16/20 213 lb 3.2 oz (96.7 kg)  09/09/20 213 lb 12.8 oz (97 kg)  07/29/20 207 lb 9.6 oz (94.2 kg)     There are no preventive care reminders to display for this patient.  There are no preventive care reminders to display for this patient.  Lab Results  Component Value Date   TSH 0.254 (L) 06/17/2020   Lab Results  Component Value Date   WBC 7.3 06/17/2020   HGB 15.5 06/17/2020   HCT 47.5 (H) 06/17/2020   MCV 85 06/17/2020   PLT 334 06/17/2020   Lab Results  Component Value Date   NA 136 10/02/2015   K 4.0 10/02/2015   CO2 26 10/02/2015   GLUCOSE 87 10/02/2015   BUN 7 10/02/2015   CREATININE 0.76 10/02/2015   BILITOT 0.3 10/02/2015   ALKPHOS 50 10/02/2015   AST 16 10/02/2015   ALT 18 10/02/2015   PROT 7.2 10/02/2015   ALBUMIN 4.0 10/02/2015   CALCIUM 9.2 10/02/2015   Lab Results    Component Value Date   CHOL 201 (H) 06/26/2019   Lab Results  Component Value Date   HDL 37 (L) 06/26/2019  Lab Results  Component Value Date   LDLCALC 137 (H) 06/26/2019   Lab Results  Component Value Date   TRIG 133 06/26/2019   Lab Results  Component Value Date   CHOLHDL 5.4 (H) 06/26/2019   Lab Results  Component Value Date   HGBA1C 5.6 10/02/2015      Assessment & Plan:   Problem List Items Addressed This Visit      Cardiovascular and Mediastinum   Acute deep vein thrombosis (DVT) of popliteal vein of right lower extremity (HCC) - Primary      No orders of the defined types were placed in this encounter.   Follow-up: No follow-ups on file.   PLAN  Continue on thinners  Return in 6 weeks  If no significant improvement or stalled improvement may consider vascular referral  Patient encouraged to call clinic with any questions, comments, or concerns.  Janeece Agee, NP

## 2020-10-28 ENCOUNTER — Encounter: Payer: Self-pay | Admitting: Registered Nurse

## 2020-10-30 ENCOUNTER — Telehealth: Payer: Self-pay | Admitting: *Deleted

## 2020-10-30 NOTE — Telephone Encounter (Signed)
Lidocaine patches denied  Put in providers box

## 2020-10-31 ENCOUNTER — Encounter: Payer: Self-pay | Admitting: Family Medicine

## 2020-10-31 ENCOUNTER — Ambulatory Visit: Payer: BC Managed Care – PPO | Admitting: Family Medicine

## 2020-10-31 ENCOUNTER — Other Ambulatory Visit: Payer: Self-pay

## 2020-10-31 VITALS — BP 130/84 | HR 83 | Temp 97.5°F | Resp 15 | Ht 64.0 in | Wt 215.2 lb

## 2020-10-31 DIAGNOSIS — Z86718 Personal history of other venous thrombosis and embolism: Secondary | ICD-10-CM | POA: Diagnosis not present

## 2020-10-31 DIAGNOSIS — R600 Localized edema: Secondary | ICD-10-CM | POA: Diagnosis not present

## 2020-10-31 DIAGNOSIS — I872 Venous insufficiency (chronic) (peripheral): Secondary | ICD-10-CM | POA: Diagnosis not present

## 2020-10-31 NOTE — Patient Instructions (Addendum)
  Take aspirin 81 mg 1 daily with food  Elevate leg when possible  Recommend wearing compression hose when it is hanging down for a long time.  If you keep having problems we might want to send you to Dr. Guss Bunde at the vein clinic of [place].  Return if further problems or if any suspicion of a recurrent blood clot.  If you ever have more blood issues you should probably see a hematologist to get tested for clotting disorders.  Stay off of birth control pills.   If you have lab work done today you will be contacted with your lab results within the next 2 weeks.  If you have not heard from [place] then please contact us. The fastest way to get your results is to register for My Chart.   IF you received an x-ray today, you will receive an invoice from [facility]. Please contact [facility] at [phone] with questions or concerns regarding your invoice.   IF you received labwork today, you will receive an invoice from [facility]. Please contact [facility] at [phone] with questions or concerns regarding your invoice.   Our billing staff will not be able to assist you with questions regarding bills from these companies.  You will be contacted with the lab results as soon as they are available. The fastest way to get your results is to activate your My Chart account. Instructions are located on the last page of this paperwork. If you have not heard from [place] regarding the results in 2 weeks, please contact this office.

## 2020-10-31 NOTE — Progress Notes (Signed)
Patient ID: Krystal Gallagher, female    DOB: 1986-06-01  Age: 34 y.o. MRN: [id number]  Chief Complaint  Patient presents with  . blood clot    pt had a blood clot and is now here for her next steps how she should follow up reports some aches and pains after walking with abnormal gait for etended period of time and would like advice how to proceed     Subjective:  Patient was diagnosed with a blood clot back in August.  She is here for follow-up of it.  She still has some pains and a little bit of abnormal gait.  There is chronic swelling in that leg.  She was on birth control pills but no longer is using them.  She ran out of the Xarelto 3 months in early November and has been off of them since then.  Current allergies, medications, problem list, past/family and social histories reviewed.  Objective:  BP 130/84   Pulse 83   Temp (!) 97.5 F (36.4 C) (Temporal)   Resp 15   Ht 5\' 4"  (1.626 m)   Wt 215 lb 3.2 oz (97.6 kg)   SpO2 98%   BMI 36.94 kg/m   Minimal tenderness in the right calf.  Negative Homans.  Mild chronic edema from the knee down.  Assessment & Plan:   Assessment: 1. Edema of right lower leg   2. Venous incompetence   3. History of DVT of lower extremity       Plan: See instructions  No orders of the defined types were placed in this encounter.   No orders of the defined types were placed in this encounter.        Patient Instructions    Take aspirin 81 mg 1 daily with food  Elevate leg when possible  Recommend wearing compression hose when it is hanging down for a long time.  If you keep having problems we might want to send you to Dr. at the vein clinic of [place].  Return if further problems or if any suspicion of a recurrent blood clot.  If you ever have more blood issues you should probably see a hematologist to get tested for clotting disorders.  Stay off of birth control pills.   If you have lab work done today you  will be contacted with your lab results within the next 2 weeks.  If you have not heard from [phone] then please contact us. The fastest way to get your results is to register for My Chart.   IF you received an x-ray today, you will receive an invoice from [facility]. Please contact [facility] at [phone] with questions or concerns regarding your invoice.   IF you received labwork today, you will receive an invoice from [facility]. Please contact [facility] at [phone] with questions or concerns regarding your invoice.   Our billing staff will not be able to assist you with questions regarding bills from these companies.  You will be contacted with the lab results as soon as they are available. The fastest way to get your results is to activate your My Chart account. Instructions are located on the last page of this paperwork. If you have not heard from [phone] regarding the results in 2 weeks, please contact this office.        Return if symptoms worsen or fail to improve.   [place], MD 10/31/2020

## 2020-12-24 ENCOUNTER — Encounter: Payer: Self-pay | Admitting: Registered Nurse

## 2020-12-24 NOTE — Progress Notes (Signed)
Acute Office Visit  Subjective:    Patient ID: Krystal Gallagher, female    DOB: Jul 22, 1986, 35 y.o.   MRN: [id number]  Chief Complaint  Patient presents with  . Hip Pain    Patient states she has been having some right hip pain and also some pain in the back of leg and she is scared that is was an clot again after the blood thinners    HPI Patient is in today for r hip pain  Radiates down leg. Scared of clot again Had taken thinners per prescription Symptoms improving overall No acute injury noted No worsening swelling, redness, or heat  No past medical history on file.  No past surgical history on file.  Family History  Problem Relation Age of Onset  . Diabetes Paternal Grandfather   . Diabetes Maternal Grandfather   . Hyperlipidemia Mother     Social History   Socioeconomic History  . Marital status: Single    Spouse name: Not on file  . Number of children: 0  . Years of education: Not on file  . Highest education level: Not on file  Occupational History  . Occupation: Magazine features editor: [employer]  Tobacco Use  . Smoking status: Never Smoker  . Smokeless tobacco: Never Used  Vaping Use  . Vaping Use: Never used  Substance and Sexual Activity  . Alcohol use: No  . Drug use: No  . Sexual activity: Yes    Comment: number of sex partners in the last 12 months 1  Other Topics Concern  . Not on file  Social History Narrative   Exercise jogging 3 times per week for 1 hour. Single. Education: [employer].   Social Determinants of Health   Financial Resource Strain: Not on file  Food Insecurity: Not on file  Transportation Needs: Not on file  Physical Activity: Not on file  Stress: Not on file  Social Connections: Not on file  Intimate Partner Violence: Not on file    Outpatient Medications Prior to Visit  Medication Sig Dispense Refill  . fexofenadine (ALLEGRA) 180 MG tablet Take 180 mg by mouth daily.    Marland Kitchen levothyroxine (SYNTHROID) 50 MCG tablet  TAKE 1 TABLET(50 MCG) BY MOUTH DAILY BEFORE BREAKFAST 90 tablet 0  . Rivaroxaban (XARELTO) 15 MG TABS tablet Take 1 tablet (15 mg total) by mouth 2 (two) times daily with a meal. 42 tablet 0  . rivaroxaban (XARELTO) 20 MG TABS tablet Take 1 tablet (20 mg total) by mouth daily with supper. 60 tablet 0  . VITAMIN D, CHOLECALCIFEROL, PO Take by mouth daily.     No facility-administered medications prior to visit.    No Known Allergies  Review of Systems  Constitutional: Negative.   HENT: Negative.   Eyes: Negative.   Respiratory: Negative.   Cardiovascular: Negative.   Gastrointestinal: Negative.   Genitourinary: Negative.   Musculoskeletal: Positive for arthralgias. Negative for back pain, gait problem, joint swelling, myalgias, neck pain and neck stiffness.  Skin: Negative.   Neurological: Negative.   Psychiatric/Behavioral: Negative.   All other systems reviewed and are negative.      Objective:    Physical Exam Vitals and nursing note reviewed.  Constitutional:      General: She is not in acute distress.    Appearance: Normal appearance. She is not ill-appearing, toxic-appearing or diaphoretic.  Cardiovascular:     Rate and Rhythm: Normal rate and regular rhythm.     Pulses: Normal pulses.  Heart sounds: Normal heart sounds. No murmur heard. No friction rub. No gallop.   Pulmonary:     Effort: Pulmonary effort is normal. No respiratory distress.     Breath sounds: Normal breath sounds. No stridor. No wheezing, rhonchi or rales.  Chest:     Chest wall: No tenderness.  Musculoskeletal:        General: No swelling, tenderness (greater trochanter of R hip), deformity or signs of injury. Normal range of motion.     Right lower leg: Edema (mild, residual from dvt) present.     Left lower leg: No edema.  Skin:    General: Skin is warm and dry.     Capillary Refill: Capillary refill takes less than 2 seconds.  Neurological:     General: No focal deficit present.      Mental Status: She is alert and oriented to person, place, and time. Mental status is at baseline.  Psychiatric:        Mood and Affect: Mood normal.        Behavior: Behavior normal.        Thought Content: Thought content normal.        Judgment: Judgment normal.     BP 118/82   Pulse 85   Temp 98 F (36.7 C) (Temporal)   Resp 18   Ht 5\' 4"  (1.626 m)   Wt 213 lb 3.2 oz (96.7 kg)   SpO2 97%   BMI 36.60 kg/m  Wt Readings from Last 3 Encounters:  10/31/20 215 lb 3.2 oz (97.6 kg)  10/16/20 213 lb 3.2 oz (96.7 kg)  09/09/20 213 lb 12.8 oz (97 kg)    Health Maintenance Due  Topic Date Due  . COVID-19 Vaccine (3 - Booster for Pfizer series) 09/28/2020    There are no preventive care reminders to display for this patient.   Lab Results  Component Value Date   TSH 0.254 (L) 06/17/2020   Lab Results  Component Value Date   WBC 7.3 06/17/2020   HGB 15.5 06/17/2020   HCT 47.5 (H) 06/17/2020   MCV 85 06/17/2020   PLT 334 06/17/2020   Lab Results  Component Value Date   NA 136 10/02/2015   K 4.0 10/02/2015   CO2 26 10/02/2015   GLUCOSE 87 10/02/2015   BUN 7 10/02/2015   CREATININE 0.76 10/02/2015   BILITOT 0.3 10/02/2015   ALKPHOS 50 10/02/2015   AST 16 10/02/2015   ALT 18 10/02/2015   PROT 7.2 10/02/2015   ALBUMIN 4.0 10/02/2015   CALCIUM 9.2 10/02/2015   Lab Results  Component Value Date   CHOL 201 (H) 06/26/2019   Lab Results  Component Value Date   HDL 37 (L) 06/26/2019   Lab Results  Component Value Date   LDLCALC 137 (H) 06/26/2019   Lab Results  Component Value Date   TRIG 133 06/26/2019   Lab Results  Component Value Date   CHOLHDL 5.4 (H) 06/26/2019   Lab Results  Component Value Date   HGBA1C 5.6 10/02/2015       Assessment & Plan:   Problem List Items Addressed This Visit   None   Visit Diagnoses    Trochanteric bursitis, right hip    -  Primary   Relevant Medications   lidocaine (LIDODERM) 5 %       Meds ordered this  encounter  Medications  . lidocaine (LIDODERM) 5 %    Sig: Place 1 patch onto the skin daily. Remove &  Discard patch within 12 hours or as directed by MD    Dispense:  30 patch    Refill:  0    Order Specific Question:   Supervising Provider    Answer:   Neva Seat, JEFFREY R [[id number]]  . traMADol (ULTRAM) 50 MG tablet    Sig: Take 1 tablet (50 mg total) by mouth every 8 (eight) hours as needed for up to 5 days.    Dispense:  15 tablet    Refill:  0    Order Specific Question:   Supervising Provider    Answer:   Neva Seat, JEFFREY R [[id number]]   PLAN  Suspect bursitis from favoring this side  Tramadol and lidocaine  nonpharm reviewed  Feel there is very low probability of dvt recurrence or worsening  Patient encouraged to call clinic with any questions, comments, or concerns.   Janeece Agee, NP

## 2021-01-28 NOTE — Telephone Encounter (Signed)
No action needed at this time, closing open encounter 

## 2021-03-16 ENCOUNTER — Other Ambulatory Visit: Payer: Self-pay | Admitting: Registered Nurse

## 2021-03-16 DIAGNOSIS — E039 Hypothyroidism, unspecified: Secondary | ICD-10-CM

## 2021-03-17 ENCOUNTER — Other Ambulatory Visit: Payer: Self-pay | Admitting: Registered Nurse

## 2021-03-17 DIAGNOSIS — E039 Hypothyroidism, unspecified: Secondary | ICD-10-CM

## 2021-03-17 NOTE — Telephone Encounter (Signed)
Needs OV.  

## 2021-03-17 NOTE — Telephone Encounter (Signed)
Needs OV for further fills 

## 2021-09-18 ENCOUNTER — Ambulatory Visit (INDEPENDENT_AMBULATORY_CARE_PROVIDER_SITE_OTHER): Payer: BC Managed Care – PPO | Admitting: Internal Medicine

## 2021-09-18 ENCOUNTER — Encounter: Payer: Self-pay | Admitting: Internal Medicine

## 2021-09-18 ENCOUNTER — Other Ambulatory Visit: Payer: Self-pay

## 2021-09-18 DIAGNOSIS — J45991 Cough variant asthma: Secondary | ICD-10-CM | POA: Diagnosis not present

## 2021-09-18 LAB — CBC WITH DIFFERENTIAL/PLATELET
Basophils Absolute: 0 10*3/uL (ref 0.0–0.1)
Basophils Relative: 0.5 % (ref 0.0–3.0)
Eosinophils Absolute: 0.3 10*3/uL (ref 0.0–0.7)
Eosinophils Relative: 4.7 % (ref 0.0–5.0)
HCT: 43.7 % (ref 36.0–46.0)
Hemoglobin: 14.5 g/dL (ref 12.0–15.0)
Lymphocytes Relative: 45.9 % (ref 12.0–46.0)
Lymphs Abs: 3.3 10*3/uL (ref 0.7–4.0)
MCHC: 33.2 g/dL (ref 30.0–36.0)
MCV: 86.1 fl (ref 78.0–100.0)
Monocytes Absolute: 0.4 10*3/uL (ref 0.1–1.0)
Monocytes Relative: 5.7 % (ref 3.0–12.0)
Neutro Abs: 3.1 10*3/uL (ref 1.4–7.7)
Neutrophils Relative %: 43.2 % (ref 43.0–77.0)
Platelets: 330 10*3/uL (ref 150.0–400.0)
RBC: 5.08 Mil/uL (ref 3.87–5.11)
RDW: 13.4 % (ref 11.5–15.5)
WBC: 7.1 10*3/uL (ref 4.0–10.5)

## 2021-09-18 MED ORDER — BUDESONIDE-FORMOTEROL FUMARATE 80-4.5 MCG/ACT IN AERO: INHALATION_SPRAY | RESPIRATORY_TRACT | 12 refills | Status: DC

## 2021-09-18 NOTE — Patient Instructions (Addendum)
Start  Symbicort 80 2 puff every 12 hours for at least a week then ok to taper down if  all better   Work on inhaler technique:  relax and gently blow all the way out then take a nice smooth full deep breath back in, triggering the inhaler at same time you start breathing in.  Hold for up to 5 seconds if you can. Blow out thru nose. Rinse and gargle with water when done.  If mouth or throat bother you at all,  try brushing teeth/gums/tongue with arm and hammer toothpaste/ make a slurry and gargle and spit out.    Try zyrtec 10 mg in evening as needed for itchy, sneezing running nose   Please remember to go to the lab department   for your tests - we will call you with the results when they are available.     Please schedule a follow up office visit in 6 weeks, call sooner if needed - must bring inhaler with you

## 2021-09-18 NOTE — Progress Notes (Signed)
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 Mostly at night.  Dyspnea:  wheezing with exertion  Cough: light /pale green  Sleep: may be twice weekly sob / wheeze  SABA use: none   No obvious day to day or daytime variability or assoc excess/ purulent sputum or mucus plugs or hemoptysis or cp or chest tightness, subjective wheeze or overt sinus or hb symptoms.    Also denies any obvious fluctuation of symptoms with weather or environmental changes or other aggravating or alleviating factors except as outlined above   No unusual exposure hx or h/o childhood pna/ asthma or knowledge of premature birth.  Current Allergies, Complete Past Medical History, Past Surgical History, Family History, and Social History were reviewed in [employer] record.  ROS  The following are not active complaints unless bolded Hoarseness, sore throat, dysphagia, dental problems, itching, sneezing,  nasal congestion or discharge of excess mucus or purulent secretions, ear ache,   fever, chills, sweats, unintended wt loss or wt gain, classically pleuritic or exertional cp,  orthopnea pnd or arm/hand swelling  or leg swelling, presyncope, palpitations, abdominal pain, anorexia, nausea, vomiting, diarrhea  or change in bowel habits or change in bladder habits,  change in stools or change in urine, dysuria, hematuria,  rash, arthralgias, visual complaints, headache, numbness, weakness or ataxia or problems with walking or coordination,  change in mood or  memory.           No past medical history on file.  Outpatient Medications Prior to Visit  Medication Sig Dispense Refill   fexofenadine (ALLEGRA) 180 MG tablet Take 180 mg by mouth daily.     levothyroxine (SYNTHROID) 50 MCG tablet TAKE 1 TABLET(50 MCG) BY MOUTH DAILY BEFORE AND BREAKFAST 90 tablet 0   VITAMIN D, CHOLECALCIFEROL, PO Take by mouth daily.     lidocaine (LIDODERM) 5 % Place 1 patch onto the skin daily. Remove & Discard patch within 12 hours or as directed by MD (Patient not taking: Reported on 09/18/2021) 30 patch 0   Rivaroxaban (XARELTO) 15 MG TABS tablet Take 1 tablet (15 mg total) by mouth 2 (two) times daily with a meal. 42 tablet 0   rivaroxaban (XARELTO) 20 MG TABS tablet Take 1 tablet (20 mg total) by mouth daily with supper. 60 tablet 0   No facility-administered medications prior to visit.     Objective:     BP 114/70 (BP Location: Left Arm, Cuff Size: Normal)   Pulse 72   Temp 98.3 F (36.8 C) (Oral)   Ht 5' 4.25" (1.632 m)   Wt 206 lb 9.6 oz (93.7 kg)   SpO2 97% Comment: RA  BMI 35.19 kg/m   SpO2: 97 % (RA)  Pleasant amb mod obese bf nad   HEENT :  pt wearing mask not removed for exam due to covid -19 concerns.    NECK :  without JVD/Nodes/TM/ nl carotid upstrokes bilaterally   LUNGS: no acc muscle use,  Nl contour chest with somewhat distant BS  bilaterally without cough on insp or exp maneuvers   CV:  RRR  no s3 or murmur or increase in P2, and no edema   ABD:  Obese soft and nontender with nl inspiratory excursion in the supine position. No bruits or organomegaly appreciated, bowel sounds nl  MS:  Nl gait/ ext warm without deformities, calf tenderness, cyanosis or clubbing No obvious joint restrictions   SKIN: warm and dry without lesions     NEURO:  alert, approp, nl sensorium with  no motor or cerebellar deficits apparent.   Distant exp    July 2022 bethany cxr nl per pt  Labs ordered 09/18/2021  :  allergy profile     Assessment   No problem-specific Assessment & Plan notes found for this encounter.     Sandrea Hughs, MD 09/18/2021

## 2021-09-19 ENCOUNTER — Encounter: Payer: Self-pay | Admitting: Internal Medicine

## 2021-09-19 ENCOUNTER — Institutional Professional Consult (permissible substitution): Payer: BC Managed Care – PPO | Admitting: Internal Medicine

## 2021-09-19 LAB — IGE: IgE (Immunoglobulin E), Serum: 275 kU/L — ABNORMAL HIGH (ref <=114)

## 2021-09-19 MED ORDER — CETIRIZINE HCL 10 MG PO TABS: 10.0000 mg | ORAL_TABLET | Freq: Every day | ORAL | Status: AC

## 2021-09-19 NOTE — Assessment & Plan Note (Signed)
Onset spring of 2021 of chest congestion/sob on background of springtime allergies since around 2007 - Allergy profile 09/18/2021 >  Eos 0.3 /  IgE   Most likely this is either asthma or UACS from GERD related to obesity and favor the former so try symbicort 80 2 bid and ok to taper if eliminates the symptoms Based on two studies from NEJM  378; 20 p 1865 (2018) and 380 : p2020-30 (2019) in pts with mild asthma it is reasonable to use low dose symbicort eg 80 2bid "prn" flare in this setting but I emphasized this was only shown with symbicort and takes advantage of the rapid onset of action but is not the same as "rescue therapy" but can be stopped once the acute symptoms have resolved and the need for rescue has been minimized (< 2 x weekly)    For nasal symptoms try change allegra to zyrtec to see if helps pending allergy screen and referral if strongly positive  - The proper method of use, as well as anticipated side effects, of a metered-dose inhaler were discussed and demonstrated to the patient using teach back method.    >>> f/u in 6 weeks         Each maintenance medication was reviewed in detail including emphasizing most importantly the difference between maintenance and prns and under what circumstances the prns are to be triggered using an action plan format where appropriate.  Total time for H and P, chart review, counseling, reviewing hfa device(s) and generating customized AVS unique to this new pt  office visit / same day charting = 48 min

## 2021-10-01 NOTE — Progress Notes (Signed)
Spoke with pt and notified of results per Dr. Sherene Sires. Pt verbalized understanding and denied any questions. She prefers to hold off on referral now and will talk with Dr Carlyon Prows more at next ov.

## 2021-10-31 ENCOUNTER — Encounter: Payer: Self-pay | Admitting: Primary Care

## 2021-10-31 ENCOUNTER — Other Ambulatory Visit: Payer: Self-pay

## 2021-10-31 ENCOUNTER — Ambulatory Visit: Payer: BC Managed Care – PPO | Admitting: Primary Care

## 2021-10-31 VITALS — BP 110/78 | HR 72 | Temp 98.2°F | Ht 64.75 in | Wt 214.0 lb

## 2021-10-31 DIAGNOSIS — Z9189 Other specified personal risk factors, not elsewhere classified: Secondary | ICD-10-CM | POA: Diagnosis not present

## 2021-10-31 DIAGNOSIS — J45991 Cough variant asthma: Secondary | ICD-10-CM

## 2021-10-31 NOTE — Progress Notes (Signed)
@Patient  ID: , female    DOB: [birth year], 35 y.o.   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 PMH significant for cough variant asthma. Patient of Dr. 31, seen for initial consult on 09/18/21.   Previous [facility] pulmonary encounter: 09/18/2021  Pulmonary/ 1st office eval/[facility]  Chief Complaint  Patient presents with   Consult    Feels mucus in lungs, starts wheezing and feeling SOB. Mostly at night.  Dyspnea:  wheezing with exertion  Cough: light /pale green  Sleep: may be twice weekly sob / wheeze  SABA use: none   No obvious day to day or daytime variability or assoc excess/ purulent sputum or mucus plugs or hemoptysis or cp or chest tightness, subjective wheeze or overt sinus or hb symptoms.    Also denies any obvious fluctuation of symptoms with weather or environmental changes or other aggravating or alleviating factors except as outlined above   No unusual exposure hx or h/o childhood pna/ asthma or knowledge of premature birth.  10/31/2021- Interim hx  Patient presents today for 4-6 week follow-up. During her last visit she was started on Symbicort 14/12/2020 two puffs twice daily, Zytec 10mg  as needed. She is doing a whole lot better since starting ICS/LABA inhaler. Shortness of breath and wheezing have resolved. She is using Symbicort 80mg  two puffs twice daily. Reviewed lab results.IGE and eosinophils were elevated on lab  work consistent with moderate-severe allergies.  Recommend starting Singulair 10mg  at bedtime. Holding off on allergy referral at the moment.   Labs 09/18/21>>IgE 275 ; Eosinophils 300   No Known Allergies  Immunization History  Administered Date(s) Administered   Influenza Split 09/30/2012   Influenza,inj,Quad PF,6+ Mos 09/15/2013, 10/19/2014   PFIZER(Purple Top)SARS-COV-2 Vaccination 02/29/2020, 03/29/2020   Tdap 03/24/2012    History reviewed. No pertinent past medical history.  Tobacco History: Social History   Tobacco Use  Smoking Status Never  Smokeless Tobacco Never   Counseling given: Not Answered   Outpatient Medications Prior to Visit  Medication Sig Dispense Refill   budesonide-formoterol (SYMBICORT) 80-4.5 MCG/ACT inhaler Take 2 puffs first thing in am and then another 2 puffs about 12 hours later. 1 each 12   fexofenadine-pseudoephedrine (ALLEGRA-D 24) 180-240 MG 24 hr tablet Take 1 tablet by mouth daily.     levothyroxine (SYNTHROID) 50 MCG tablet TAKE 1 TABLET(50 MCG) BY MOUTH DAILY BEFORE AND BREAKFAST 90 tablet 0   VITAMIN D, CHOLECALCIFEROL, PO Take by mouth daily.     cetirizine (ZYRTEC ALLERGY) 10 MG tablet Take 1 tablet (10 mg total) by mouth daily. (Patient not taking: Reported on 10/31/2021)     No facility-administered medications prior to visit.    Review of Systems  Review of Systems  Constitutional: Negative.  Negative for fatigue.  HENT: Negative.    Respiratory:  Negative for apnea, cough, shortness of breath and wheezing.   Cardiovascular: Negative.   Psychiatric/Behavioral:  Negative for sleep disturbance.     Physical Exam  BP 110/78 (BP Location: Left Arm, Patient Position: Sitting, Cuff Size: Large)   Pulse 72   Temp 98.2 F (36.8 C) (Oral)   Ht 5' 4.75" (1.645 m)   Wt 214 lb (97.1 kg)   SpO2 98%   BMI 35.89 kg/m  Physical Exam Constitutional:      Appearance: Normal appearance.  HENT:     Head: Normocephalic and  atraumatic.     Mouth/Throat:     Mouth: Mucous membranes are moist.     Pharynx: Oropharynx is clear.     Comments: Mallampati class II-III Cardiovascular:     Rate and Rhythm: Normal rate and regular rhythm.  Pulmonary:     Effort: Pulmonary effort is normal.     Breath sounds: Normal breath sounds.     Comments: CTA Musculoskeletal:        General: Normal range of motion.  Skin:    General: Skin is warm and dry.  Neurological:     General: No focal deficit present.     Mental Status: She is alert and oriented to person, place, and time. Mental status is at baseline.  Psychiatric:        Mood and Affect: Mood normal.        Behavior: Behavior normal.        Thought Content: Thought content normal.        Judgment: Judgment normal.     Lab Results:  CBC    Component Value Date/Time   WBC 7.1 09/18/2021 1619   RBC 5.08 09/18/2021 1619   HGB 14.5 09/18/2021 1619   HGB 15.5 06/17/2020 1052   HCT 43.7 09/18/2021 1619   HCT 47.5 (H) 06/17/2020 1052   PLT 330.0 09/18/2021 1619   PLT 334 06/17/2020 1052   MCV 86.1 09/18/2021 1619   MCV 85 06/17/2020 1052   MCH 27.7 06/17/2020 1052   MCH 28.1 10/02/2015 1552   MCHC 33.2 09/18/2021 1619   RDW 13.4 09/18/2021 1619   RDW 14.0 06/17/2020 1052   LYMPHSABS 3.3 09/18/2021 1619   LYMPHSABS 2.9 06/17/2020 1052   MONOABS 0.4 09/18/2021 1619   EOSABS 0.3 09/18/2021 1619   EOSABS 0.3 06/17/2020 1052   BASOSABS 0.0 09/18/2021 1619   BASOSABS 0.1 06/17/2020 1052    BMET    Component Value Date/Time   NA 136 10/02/2015 1552   K 4.0 10/02/2015 1552   CL 99 10/02/2015 1552   CO2 26 10/02/2015 1552   GLUCOSE 87 10/02/2015 1552   BUN 7 10/02/2015 1552   CREATININE 0.76 10/02/2015 1552   CALCIUM 9.2 10/02/2015 1552   GFRNONAA >89 06/15/2014 1415   GFRAA >89 06/15/2014 1415    BNP No results found for: BNP  ProBNP No results found for: PROBNP  Imaging: No results found.   Assessment & Plan:   Cough variant  asthma -Significantly improved with addition of low dose ICS/LABA -Continue Symbicort 80mg  two puffs twice daily -Start Singulair 10mg  at bedtime -Check PFTs and resp allergy panel/cbc upon return -FU in 3 months or sooner if needed   At risk for sleep apnea -Patient reports snoring. No daytime fatigue. Epworth 4 -Holding off on sleep study at this time. If sleep symptoms worsen consider getting HST -Encourage weight loss efforts   , NP 10/31/2021

## 2021-10-31 NOTE — Patient Instructions (Addendum)
Recommendations: Continue Symbicort 80mg  two puffs morning and evening (rinse mouth after use) Start Singulair 10mg  at bedtime   Orders: PFTs in 3 months  Resp allergy panel and CBC with diff (on return)  Follow-up: 3 months with Dr. with 1 hour PFTs prior     Asthma, Adult Asthma is a long-term (chronic) condition in which the airways get tight and narrow. The airways are the breathing passages that lead from the nose and mouth down into the lungs. A person with asthma will have times when symptoms get worse. These are called asthma attacks. They can cause coughing, whistling sounds when you breathe (wheezing), shortness of breath, and chest pain. They can make it hard to breathe. There is no cure for asthma, but medicines and lifestyle changes can help control it. There are many things that can bring on an asthma attack or make asthma symptoms worse (triggers). Common triggers include: Mold. Dust. Cigarette smoke. Cockroaches. Things that can cause allergy symptoms (allergens). These include animal skin flakes (dander) and pollen from trees or grass. Things that pollute the air. These may include household cleaners, wood smoke, smog, or chemical odors. Cold air, weather changes, and wind. Crying or laughing hard. Stress. Certain medicines or drugs. Certain foods such as dried fruit, potato chips, and grape juice. Infections, such as a cold or the flu. Certain medical conditions or diseases. Exercise or tiring activities. Asthma may be treated with medicines and by staying away from the things that cause asthma attacks. Types of medicines may include: Controller medicines. These help prevent asthma symptoms. They are usually taken every day. Fast-acting reliever or rescue medicines. These quickly relieve asthma symptoms. They are used as needed and provide short-term relief. Allergy medicines if your attacks are brought on by allergens. Medicines to help control the body's  defense (immune) system. Follow these instructions at home: Avoiding triggers in your home Change your heating and air conditioning filter often. Limit your use of fireplaces and wood stoves. Get rid of pests (such as roaches and mice) and their droppings. Throw away plants if you see mold on them. Clean your floors. Dust regularly. Use cleaning products that do not smell. Have someone vacuum when you are not home. Use a vacuum cleaner with a HEPA filter if possible. Replace carpet with wood, tile, or vinyl flooring. Carpet can trap animal skin flakes and dust. Use allergy-proof pillows, mattress covers, and box spring covers. Wash bed sheets and blankets every week in hot water. Dry them in a dryer. Keep your bedroom free of any triggers. Avoid pets and keep windows closed when things that cause allergy symptoms are in the air. Use blankets that are made of polyester or cotton. Clean bathrooms and kitchens with bleach. If possible, have someone repaint the walls in these rooms with mold-resistant paint. Keep out of the rooms that are being cleaned and painted. Wash your hands often with soap and water. If soap and water are not available, use hand sanitizer. Do not allow anyone to smoke in your home. General instructions Take over-the-counter and prescription medicines only as told by your doctor. Talk with your doctor if you have questions about how or when to take your medicines. Make note if you need to use your medicines more often than usual. Do not use any products that contain nicotine or tobacco, such as cigarettes and e-cigarettes. If you need help quitting, ask your doctor. Stay away from secondhand smoke. Avoid doing things outdoors when allergen counts are high  and when air quality is low. Wear a ski mask when doing outdoor activities in the winter. The mask should cover your nose and mouth. Exercise indoors on cold days if you can. Warm up before you exercise. Take time to  cool down after exercise. Use a peak flow meter as told by your doctor. A peak flow meter is a tool that measures how well the lungs are working. Keep track of the peak flow meter's readings. Write them down. Follow your asthma action plan. This is a written plan for taking care of your asthma and treating your attacks. Make sure you get all the shots (vaccines) that your doctor recommends. Ask your doctor about a flu shot and a pneumonia shot. Keep all follow-up visits as told by your doctor. This is important. Contact a doctor if: You have wheezing, shortness of breath, or a cough even while taking medicine to prevent attacks. The mucus you cough up (sputum) is thicker than usual. The mucus you cough up changes from clear or white to yellow, green, gray, or bloody. You have problems from the medicine you are taking, such as: A rash. Itching. Swelling. Trouble breathing. You need reliever medicines more than 2-3 times a week. Your peak flow reading is still at 50-79% of your personal best after following the action plan for 1 hour. You have a fever. Get help right away if: You seem to be worse and are not responding to medicine during an asthma attack. You are short of breath even at rest. You get short of breath when doing very little activity. You have trouble eating, drinking, or talking. You have chest pain or tightness. You have a fast heartbeat. Your lips or fingernails start to turn blue. You are light-headed or dizzy, or you faint. Your peak flow is less than 50% of your personal best. You feel too tired to breathe normally. Summary Asthma is a long-term (chronic) condition in which the airways get tight and narrow. An asthma attack can make it hard to breathe. Asthma cannot be cured, but medicines and lifestyle changes can help control it. Make sure you understand how to avoid triggers and how and when to use your medicines. This information is not intended to replace  advice given to you by your health care provider. Make sure you discuss any questions you have with your health care provider. Document Revised: 03/10/2020 Document Reviewed: 03/20/2020 Elsevier Patient Education  2022 [employer].

## 2021-10-31 NOTE — Assessment & Plan Note (Addendum)
-  Significantly improved with addition of low dose ICS/LABA -Continue Symbicort 80mg  two puffs twice daily -Start Singulair 10mg  at bedtime -Check PFTs and resp allergy panel/cbc upon return -FU in 3 months or sooner if needed

## 2021-10-31 NOTE — Assessment & Plan Note (Signed)
-  Patient reports snoring. No daytime fatigue. Epworth 4 -Holding off on sleep study at this time. If sleep symptoms worsen consider getting HST -Encourage weight loss efforts

## 2021-10-31 NOTE — Addendum Note (Signed)
Addended by: Demetrio Lapping E on: 10/31/2021 04:15 PM   Modules accepted: Orders

## 2022-02-26 ENCOUNTER — Other Ambulatory Visit: Payer: Self-pay | Admitting: *Deleted

## 2022-02-26 DIAGNOSIS — J45991 Cough variant asthma: Secondary | ICD-10-CM

## 2022-02-27 ENCOUNTER — Ambulatory Visit: Payer: BC Managed Care – PPO | Admitting: Internal Medicine

## 2022-02-27 ENCOUNTER — Encounter: Payer: BC Managed Care – PPO | Admitting: Primary Care

## 2022-10-27 ENCOUNTER — Other Ambulatory Visit: Payer: Self-pay | Admitting: Internal Medicine
